# Patient Record
Sex: Female | Born: 2000
Health system: Southern US, Community
[De-identification: ages and names within clinical notes are randomized; demographics above are authoritative.]

## PROBLEM LIST (undated history)

## (undated) DIAGNOSIS — T783XXA Angioneurotic edema, initial encounter: Secondary | ICD-10-CM

## (undated) DIAGNOSIS — R000 Tachycardia, unspecified: Secondary | ICD-10-CM

## (undated) DIAGNOSIS — T7840XA Allergy, unspecified, initial encounter: Secondary | ICD-10-CM

## (undated) DIAGNOSIS — F32A Depression, unspecified: Secondary | ICD-10-CM

## (undated) DIAGNOSIS — K219 Gastro-esophageal reflux disease without esophagitis: Secondary | ICD-10-CM

## (undated) DIAGNOSIS — J45909 Unspecified asthma, uncomplicated: Secondary | ICD-10-CM

## (undated) DIAGNOSIS — L509 Urticaria, unspecified: Secondary | ICD-10-CM

## (undated) HISTORY — DX: Allergy, unspecified, initial encounter: T78.40XA

## (undated) HISTORY — DX: Angioneurotic edema, initial encounter: T78.3XXA

## (undated) HISTORY — DX: Gastro-esophageal reflux disease without esophagitis: K21.9

## (undated) HISTORY — PX: BREAST SURGERY: SHX581

## (undated) HISTORY — DX: Depression, unspecified: F32.A

## (undated) HISTORY — DX: Urticaria, unspecified: L50.9

## (undated) HISTORY — DX: Unspecified asthma, uncomplicated: J45.909

---
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
## 2022-01-02 ENCOUNTER — Other Ambulatory Visit (HOSPITAL_COMMUNITY): Payer: Self-pay

## 2022-01-02 MED ORDER — DROSPIRENONE-ETHINYL ESTRADIOL 3-0.02 MG PO TABS
1.0000 | ORAL_TABLET | Freq: Every day | ORAL | 0 refills | Status: DC
  Filled 2022-01-02 – 2022-03-17 (×2): qty 84, 84d supply, fill #0

## 2022-01-02 MED ORDER — DROSPIRENONE-ETHINYL ESTRADIOL 3-0.02 MG PO TABS
1.0000 | ORAL_TABLET | Freq: Every day | ORAL | 0 refills | Status: DC
  Filled 2022-01-02: qty 84, 84d supply, fill #0

## 2022-03-17 ENCOUNTER — Other Ambulatory Visit (HOSPITAL_COMMUNITY): Payer: Self-pay

## 2022-03-18 DIAGNOSIS — M9901 Segmental and somatic dysfunction of cervical region: Secondary | ICD-10-CM | POA: Diagnosis not present

## 2022-03-18 DIAGNOSIS — M9903 Segmental and somatic dysfunction of lumbar region: Secondary | ICD-10-CM | POA: Diagnosis not present

## 2022-03-18 DIAGNOSIS — M5416 Radiculopathy, lumbar region: Secondary | ICD-10-CM | POA: Diagnosis not present

## 2022-03-18 DIAGNOSIS — M9904 Segmental and somatic dysfunction of sacral region: Secondary | ICD-10-CM | POA: Diagnosis not present

## 2022-03-18 DIAGNOSIS — M9902 Segmental and somatic dysfunction of thoracic region: Secondary | ICD-10-CM | POA: Diagnosis not present

## 2022-03-19 ENCOUNTER — Encounter: Payer: Self-pay | Admitting: Allergy

## 2022-03-19 ENCOUNTER — Ambulatory Visit: Payer: 59 | Admitting: Allergy

## 2022-03-19 VITALS — BP 114/70 | HR 99 | Temp 97.4°F | Resp 18 | Ht 62.0 in | Wt 154.2 lb

## 2022-03-19 DIAGNOSIS — L508 Other urticaria: Secondary | ICD-10-CM | POA: Diagnosis not present

## 2022-03-19 DIAGNOSIS — J3089 Other allergic rhinitis: Secondary | ICD-10-CM

## 2022-03-19 DIAGNOSIS — H1013 Acute atopic conjunctivitis, bilateral: Secondary | ICD-10-CM | POA: Diagnosis not present

## 2022-03-19 DIAGNOSIS — T783XXD Angioneurotic edema, subsequent encounter: Secondary | ICD-10-CM

## 2022-03-19 DIAGNOSIS — T7800XA Anaphylactic reaction due to unspecified food, initial encounter: Secondary | ICD-10-CM

## 2022-03-19 MED ORDER — METHYLPREDNISOLONE ACETATE 40 MG/ML IJ SUSP
40.0000 mg | Freq: Once | INTRAMUSCULAR | Status: AC
  Administered 2022-03-19: 40 mg via INTRAMUSCULAR

## 2022-03-19 MED ORDER — EPINEPHRINE 0.3 MG/0.3ML IJ SOAJ
INTRAMUSCULAR | 1 refills | Status: DC
  Filled 2022-03-19: qty 2, 30d supply, fill #0

## 2022-03-19 MED ORDER — RYALTRIS 665-25 MCG/ACT NA SUSP: NASAL | 2 refills | Status: DC

## 2022-03-19 NOTE — Patient Instructions (Addendum)
Hives/Swelling (urticaria/angiodema) ?Take Zyrtec or Xyzal 1 tab daily with Pepcid 20mg  1 tab daily.   ?If you continue to have swelling frequently then increase the above to twice a day dosing of both. ?If you continue to have swelling then would consider Xolair monthly injections for swelling control.   ? ?Allergic rhinitis ?Continue allergen avoidance measures directed toward pollen, dust mite, cat, dog, and cockroach as listed below ?Continue cetirizine 10 mg or Xyzal 5mg  once a day as needed for runny nose or itch ?Use Ryaltris 1-2 spray in each nostril twice a day for nasal congestion or drainage ?Consider saline nasal rinses as needed for nasal symptoms. Use this before any medicated nasal sprays for best result ?Consider allergy shots as a means of long-term control.  Allergy shots "re-train" and "reset" the immune system to ignore environmental allergens and decrease the resulting immune response to those allergens (sneezing, itchy watery eyes, runny nose, nasal congestion, etc).  Allergy shots improve symptoms in 80-85% of patients. We can discuss more at the next appointment if the medications are not working for you. ? ?Allergic conjunctivitis ?Some over the counter eye drops include Pataday one drop in each eye once a day as needed for red, itchy eyes OR Zaditor one drop in each eye twice a day as needed for red itchy eyes. ? ?Foods ?Continue to avoid soy, peach, cherry, and mango.  In case of an allergic reaction, take Benadryl 50 mg every 4 hours, and if life-threatening symptoms occur, inject with EpiPen 0.3 mg. ? ?Call the clinic if this treatment plan is not working well for you ? ?Follow up in 4-6 months or sooner if needed. ?

## 2022-03-19 NOTE — Progress Notes (Signed)
? ? ?Follow-up Note ? ?RE: Victoria Dunn MRN: [id number] DOB: Jul 01, 2001 ?Date of Office Visit: 03/19/2022 ? ? ?History of present illness: ?Victoria Dunn is a 21 y.o. female presenting today for follow-up of urticaria, allergic rhinitis with conjunctivitis and food allergy. ?She was last seen in the office on 03/18/2021 by our nurse practitioner Ambs.  She presents today with her mother.   ? ?She reports on 03/08/22 she had yogurt around 9am.  The yogurt was from the cafeteria at school and it was buffet style so he was next to a container of peaches.  She is not sure if there was cross-contamination in the yogurt with the peaches as she is reactive to peaches.  She states the next morning she woke up with lip swelling.  The swelling was more prominent than the typical lip swelling she has had.  She did use her epinephrine device due to that and then went to sleep.  When she woke back up the swelling had resolved.  She however states she has been having lip swelling about every week to 2 weeks on average. ?She takes zyrtec daily.   ?Mother just bought xyzal for her to use.  ?She states she tries her best to avoid soy, peach, cherry and mango foods.  However she states soy seems to be.  So she is not sure if she is getting accidental exposures to soy.  She has access to an epinephrine device which we will refill for her. ?She states she has not had any further hive episodes since last year. ? ?She feels like her allergy symptoms are worse and she is interested in allergy shots.  She states she is doing a lot of sneezing and has throat itch as well as cough.  She would like to get a refill of the Dymista spray because that was helpful for her nasal symptoms previously.  She has access to olopatadine eyedrops but has not required use.   ? ?Review of systems: ?Review of Systems  ?Constitutional: Negative.   ?HENT:    ?     See HPI  ?Eyes: Negative.   ?Respiratory: Negative.    ?Cardiovascular: Negative.    ?Gastrointestinal: Negative.   ?Musculoskeletal: Negative.   ?Skin:   ?     See HPI  ?Allergic/Immunologic: Negative.   ?Neurological: Negative.    ? ?All other systems negative unless noted above in HPI ? ?Past medical/social/surgical/family history have been reviewed and are unchanged unless specifically indicated below. ? ?No changes ? ?Medication List: ?Current Outpatient Medications  ?Medication Sig Dispense Refill  ? Azelastine-Fluticasone 137-50 MCG/ACT SUSP Place 1 spray into the nose 2 (two) times daily. 23 g 0  ? drospirenone-ethinyl estradiol (YAZ) 3-0.02 MG tablet Take 1 tablet by mouth daily. 84 tablet 0  ? EPINEPHrine 0.3 mg/0.3 mL IJ SOAJ injection INJECT 0.3 MLS INTO THE MUSCLE ONCE FOR 1 DOSE. 2 each 1  ? famotidine (PEPCID) 20 MG tablet Take 1 tablet (20 mg total) by mouth 2 (two) times daily. 60 tablet 3  ? drospirenone-ethinyl estradiol (YAZ) 3-0.02 MG tablet TAKE 1 TABLET EVERY DAY BY MOUTH 84 tablet 4  ? ?No current facility-administered medications for this visit.  ?  ? ?Known medication allergies: ?No Known Allergies ? ? ?Physical examination: ?Blood pressure 114/70, pulse 99, temperature (!) 97.4 ?F (36.3 ?C), resp. rate 18, height 5\' 2"  (1.575 m), weight 154 lb 3.2 oz (69.9 kg), SpO2 99 %. ? ?General: Alert, interactive, in no acute distress. ?  HEENT: PERRLA, TMs pearly gray, turbinates mildly edematous without discharge, post-pharynx non erythematous. ?Neck: Supple without lymphadenopathy. ?Lungs: Clear to auscultation without wheezing, rhonchi or rales. {no increased work of breathing. ?CV: Normal S1, S2 without murmurs. ?Abdomen: Nondistended, nontender. ?Skin: Warm and dry, without lesions or rashes. ?Extremities:  No clubbing, cyanosis or edema. ?Neuro:   Grossly intact. ? ?Diagnositics/Labs: ?Depomedrol 40mg  injection provided in office ? ?Assessment and plan: ?  ?Hives/Swelling (urticaria/angiodema) ?Take Zyrtec or Xyzal 1 tab daily with Pepcid 20mg  1 tab daily.   ?If you continue to  have swelling frequently then increase the above to twice a day dosing of both. ?If you continue to have swelling then would consider Xolair monthly injections for swelling control.  Discussed protocol, benefits and risk and provided with informational brochure. ?Depomedrol injection provided in office ? ?Allergic rhinitis ?Continue allergen avoidance measures directed toward pollen, dust mite, cat, dog, and cockroach as listed below ?Continue cetirizine 10 mg or Xyzal 5mg  once a day as needed for runny nose or itch ?Use Ryaltris 1-2 spray in each nostril twice a day for nasal congestion or drainage ?Consider saline nasal rinses as needed for nasal symptoms. Use this before any medicated nasal sprays for best result ?Consider allergy shots as a means of long-term control.  Allergy shots "re-train" and "reset" the immune system to ignore environmental allergens and decrease the resulting immune response to those allergens (sneezing, itchy watery eyes, runny nose, nasal congestion, etc).  Allergy shots improve symptoms in 80-85% of patients. Depomedrol 40mg  injection provided today for symptom improvement. ? ?Allergic conjunctivitis ?Some over the counter eye drops include Pataday one drop in each eye once a day as needed for red, itchy eyes OR Zaditor one drop in each eye twice a day as needed for red itchy eyes. ? ?Foods ?Continue to avoid soy, peach, cherry, and mango.  In case of an allergic reaction, take Benadryl 50 mg every 4 hours, and if life-threatening symptoms occur, inject with EpiPen 0.3 mg. ? ?Call the clinic if this treatment plan is not working well for you ? ?Follow up in 4-6 months or sooner if needed. ? ?I appreciate the opportunity to take part in Christen's care. Please do not hesitate to contact me with questions. ? ?Sincerely, ? ? ? , MD ?Allergy/Immunology ?[facility] ? ? ?

## 2022-03-20 ENCOUNTER — Other Ambulatory Visit (HOSPITAL_COMMUNITY): Payer: Self-pay

## 2022-03-23 HISTORY — PX: BREAST REDUCTION SURGERY: SHX8

## 2022-03-25 ENCOUNTER — Ambulatory Visit (INDEPENDENT_AMBULATORY_CARE_PROVIDER_SITE_OTHER): Payer: 59 | Admitting: Surgical

## 2022-03-25 ENCOUNTER — Encounter: Payer: Self-pay | Admitting: Surgical

## 2022-03-25 ENCOUNTER — Other Ambulatory Visit (HOSPITAL_COMMUNITY): Payer: Self-pay

## 2022-03-25 VITALS — BP 139/92 | HR 100 | Ht 62.0 in | Wt 153.8 lb

## 2022-03-25 DIAGNOSIS — M545 Low back pain, unspecified: Secondary | ICD-10-CM

## 2022-03-25 DIAGNOSIS — N62 Hypertrophy of breast: Secondary | ICD-10-CM

## 2022-03-25 DIAGNOSIS — M546 Pain in thoracic spine: Secondary | ICD-10-CM

## 2022-03-25 DIAGNOSIS — M4004 Postural kyphosis, thoracic region: Secondary | ICD-10-CM

## 2022-03-25 MED ORDER — HYDROCODONE-ACETAMINOPHEN 5-325 MG PO TABS
1.0000 | ORAL_TABLET | Freq: Four times a day (QID) | ORAL | 0 refills | Status: AC | PRN
  Filled 2022-03-25: qty 28, 7d supply, fill #0

## 2022-03-25 MED ORDER — ONDANSETRON HCL 4 MG PO TABS
4.0000 mg | ORAL_TABLET | Freq: Three times a day (TID) | ORAL | 0 refills | Status: DC | PRN
  Filled 2022-03-25: qty 20, 7d supply, fill #0

## 2022-03-25 NOTE — Progress Notes (Signed)
? ?  Patient ID: Victoria Dunn, female    DOB: [birth year], 21 y.o.   MRN: [id number] ? ?Chief Complaint  ?Patient presents with  ? Pre-op Exam  ? ? ?  ICD-10-CM   ?1. Macromastia  N62   ?  ?2. Back pain of thoracolumbar region  M54.50   ? M54.6   ?  ?3. Postural kyphosis, thoracic region  M40.04   ?  ? ? ?History of Present Illness: ?Victoria Dunn is a 21 y.o.  female  with a history of macromastia.  She presents for preoperative evaluation for upcoming procedure, Bilateral Breast Reduction, scheduled for 04/03/2022 with Dr.  Arita Miss. ?Patient is a very pleasant 21 year old female who presents today with her mother and father for preop. ? ?The patient has not had problems with anesthesia. No history of DVT/PE.  No family history of DVT/PE.  No family or personal history of bleeding or clotting disorders.  Patient is not currently taking any blood thinners.  No history of CVA/MI.  ? ?Summary of Previous Visit: She is currently around an I cup and would like to be around a C cup.  She does not smoke.  She is not a diabetic.  No family history of breast cancer. ? ?Estimated excess breast tissue to be removed at time of surgery: 400 grams ? ?PMH Significant for: On OCP.  No other significant past medical history. ? ?Patient is doing well, no recent changes to her health. ? ?She would like to be about a C cup**.  If possible, patient requests photos of breast tissue removed** ? ? ?Past Medical History: ?Allergies: ?No Known Allergies ? ?Current Medications: ? ?Current Outpatient Medications:  ?  Azelastine-Fluticasone 137-50 MCG/ACT SUSP, Place 1 spray into the nose 2 (two) times daily., Disp: 23 g, Rfl: 0 ?  drospirenone-ethinyl estradiol (YAZ) 3-0.02 MG tablet, Take 1 tablet by mouth daily., Disp: 84 tablet, Rfl: 0 ?  EPINEPHrine 0.3 mg/0.3 mL IJ SOAJ injection, INJECT 0.3 MLS INTO THE MUSCLE ONCE FOR 1 DOSE., Disp: 2 each, Rfl: 1 ?  famotidine (PEPCID) 20 MG tablet, Take 1 tablet (20 mg total) by mouth 2 (two) times  daily., Disp: 60 tablet, Rfl: 3 ?  HYDROcodone-acetaminophen (NORCO) 5-325 MG tablet, Take 1 tablet by mouth every 6 (six) hours as needed for up to 7 days for moderate pain., Disp: 28 tablet, Rfl: 0 ?  Olopatadine-Mometasone (RYALTRIS) 665-25 MCG/ACT SUSP, Place 1-2 spray in each nostril twice a day for nasal congestion or drainage, Disp: 29 g, Rfl: 2 ?  ondansetron (ZOFRAN) 4 MG tablet, Take 1 tablet (4 mg total) by mouth every 8 (eight) hours as needed for nausea or vomiting., Disp: 20 tablet, Rfl: 0 ?  drospirenone-ethinyl estradiol (YAZ) 3-0.02 MG tablet, TAKE 1 TABLET EVERY DAY BY MOUTH, Disp: 84 tablet, Rfl: 4 ? ?Past Medical Problems: ?Past Medical History:  ?Diagnosis Date  ? Angio-edema   ? Urticaria   ? ? ?Past Surgical History: ?Past Surgical History:  ?Procedure Laterality Date  ? FOOT SURGERY Bilateral 10/2018  ? ? ?Social History: ?Social History  ? ?Socioeconomic History  ? Marital status: Single  ?  Spouse name: Not on file  ? Number of children: Not on file  ? Years of education: Not on file  ? Highest education level: Not on file  ?Occupational History  ? Not on file  ?Tobacco Use  ? Smoking status: Never  ? Smokeless tobacco: Never  ?Vaping Use  ? Vaping Use: Not  on file  ?Substance and Sexual Activity  ? Alcohol use: No  ? Drug use: No  ? Sexual activity: Never  ?  Birth control/protection: Pill  ?Other Topics Concern  ? Not on file  ?Social History Narrative  ? Lives with both parents and younger sister in the same household.  ? Mother is an Charity fundraiser.  ? ?Social Determinants of Health  ? ?Financial Resource Strain: Not on file  ?Food Insecurity: Not on file  ?Transportation Needs: Not on file  ?Physical Activity: Not on file  ?Stress: Not on file  ?Social Connections: Not on file  ?Intimate Partner Violence: Not on file  ? ? ?Family History: ?Family History  ?Problem Relation Age of Onset  ? Allergies Mother   ? Food Allergy Mother   ? ? ?Review of Systems: ?Review of Systems  ?Constitutional:  Negative.   ?Respiratory: Negative.    ?Cardiovascular: Negative.   ?Neurological: Negative.   ? ?Physical Exam: ?Vital Signs ?BP (!) 139/92 (BP Location: Left Arm, Patient Position: Sitting, Cuff Size: Small)   Pulse 100   Ht 5\' 2"  (1.575 m)   Wt 153 lb 12.8 oz (69.8 kg)   SpO2 100%   BMI 28.13 kg/m?  ? ?Physical Exam  ?Constitutional:   ?   General: Not in acute distress. ?   Appearance: Normal appearance. Not ill-appearing.  ?HENT:  ?   Head: Normocephalic and atraumatic.  ?Eyes:  ?   Pupils: Pupils are equal, round ?Neck:  ?   Musculoskeletal: Normal range of motion.  ?Cardiovascular:  ?   Rate and Rhythm: Normal rate ?   Pulses: Normal pulses.  ?Pulmonary:  ?   Effort: Pulmonary effort is normal. No respiratory distress.  ?Musculoskeletal: Normal range of motion.  ?Skin: ?   General: Skin is warm and dry.  ?   Findings: No erythema or rash.  ?Neurological:  ?   General: No focal deficit present.  ?   Mental Status: Alert and oriented to person, place, and time. Mental status is at baseline.  ?   Motor: No weakness.  ?Psychiatric:     ?   Mood and Affect: Mood normal.     ?   Behavior: Behavior normal.  ? ? ?Assessment/Plan: ?The patient is scheduled for bilateral breast reduction with Dr. .  Risks, benefits, and alternatives of procedure discussed, questions answered and consent obtained.   ? ?Smoking Status: Non-smoker; Counseling Given?  N/A ?Last Mammogram: No mammographic history ? ?Caprini Score: 4, moderate; Risk Factors include: Age, BMI greater than 25, on OCPs and length of planned surgery. Recommendation for mechanical prophylaxis. Encourage early ambulation.  ? ?Pictures obtained: @consult  on 11/20/2021 ? ?Post-op Rx sent to pharmacy: Norco, Zofran ? ?Patient was provided with the breast reduction and General Surgical Risk consent document and Pain Medication Agreement prior to their appointment.  They had adequate time to read through the risk consent documents and Pain Medication  Agreement. We also discussed them in person together during this preop appointment. All of their questions were answered to their satisfaction.  Recommended calling if they have any further questions.  Risk consent form and Pain Medication Agreement to be scanned into patient's chart. ? ?The risk that can be encountered with breast reduction were discussed and include the following but not limited to these:  Breast asymmetry, fluid accumulation, firmness of the breast, inability to breast feed, loss of nipple or areola, skin loss, decrease or no nipple sensation, fat necrosis of the breast  tissue, bleeding, infection, healing delay.  There are risks of anesthesia, changes to skin sensation and injury to nerves or blood vessels.  The muscle can be temporarily or permanently injured.  You may have an allergic reaction to tape, suture, glue, blood products which can result in skin discoloration, swelling, pain, skin lesions, poor healing.  Any of these can lead to the need for revisonal surgery or stage procedures.  A reduction has potential to interfere with diagnostic procedures.  Nipple or breast piercing can increase risks of infection.  This procedure is best done when the breast is fully developed.  Changes in the breast will continue to occur over time.  Pregnancy can alter the outcomes of previous breast reduction surgery, weight gain and weigh loss can also effect the long term appearance.  ? ? ? ?Electronically signed by: Kermit BaloMatthew J Pennye Beeghly, PA-C 03/25/2022 3:47 PM ?

## 2022-03-30 ENCOUNTER — Telehealth: Payer: Self-pay | Admitting: Plastic Surgery

## 2022-03-30 NOTE — Telephone Encounter (Signed)
Called patient to advise that  her surgery would be starting at 11:00 am instead of 12:00 pm and she would need to be at [facility] at 9:30am. Patient stated understanding.  ?

## 2022-04-03 DIAGNOSIS — M4004 Postural kyphosis, thoracic region: Secondary | ICD-10-CM | POA: Diagnosis not present

## 2022-04-03 DIAGNOSIS — N62 Hypertrophy of breast: Secondary | ICD-10-CM | POA: Diagnosis not present

## 2022-04-03 DIAGNOSIS — M545 Low back pain, unspecified: Secondary | ICD-10-CM | POA: Diagnosis not present

## 2022-04-06 ENCOUNTER — Encounter (HOSPITAL_BASED_OUTPATIENT_CLINIC_OR_DEPARTMENT_OTHER): Payer: Self-pay

## 2022-04-06 ENCOUNTER — Ambulatory Visit (HOSPITAL_BASED_OUTPATIENT_CLINIC_OR_DEPARTMENT_OTHER): Admit: 2022-04-06 | Payer: 59 | Admitting: Plastic Surgery

## 2022-04-06 SURGERY — MAMMOPLASTY, REDUCTION
Anesthesia: General | Site: Breast | Laterality: Bilateral

## 2022-04-09 ENCOUNTER — Telehealth: Payer: Self-pay | Admitting: *Deleted

## 2022-04-09 ENCOUNTER — Ambulatory Visit (INDEPENDENT_AMBULATORY_CARE_PROVIDER_SITE_OTHER): Payer: 59 | Admitting: Plastic Surgery

## 2022-04-09 ENCOUNTER — Encounter: Payer: Self-pay | Admitting: Plastic Surgery

## 2022-04-09 DIAGNOSIS — N62 Hypertrophy of breast: Secondary | ICD-10-CM

## 2022-04-09 NOTE — Progress Notes (Signed)
Patient presents about 1 week out from bilateral breast reduction.  She feels good and is happy.  On exam everything looks to be healing fine with intact incisions and viable nipple areolar complexes.  There is no subcutaneous fluid.  I have asked her to continue compressive garments and avoid strenuous activity.  We will see her again in a few weeks.  

## 2022-04-09 NOTE — Telephone Encounter (Signed)
Received a call from the patient's mother on (04/07/22) stating that the patient had a breast reduction on last Friday.  The patient wake up this morning the ace wrap had come off,the bandages were off, and she was on her stomach.  She stated that they was no drainage or major issues.    The mother stated that she wanted to know if there is something she needs to look out for.    Informed the mother that she can watch for any extra drainage.  Asked the mother to let the patient know to try to stay off her stomach,which we know is hard to do.  Make sure she keeps the wrap snug,but not to tight.   The mother verbalized understanding and agreed.//AB/CMA

## 2022-04-15 ENCOUNTER — Encounter: Payer: 59 | Admitting: Plastic Surgery

## 2022-04-15 ENCOUNTER — Telehealth: Payer: Self-pay | Admitting: *Deleted

## 2022-04-15 NOTE — Telephone Encounter (Signed)
Received [facility] Report from [facility] via of mail.  Given to provider to review.    Surgical Pathology Report reviewed and copy scanned into the chart.//AB/CMA

## 2022-04-30 ENCOUNTER — Ambulatory Visit (INDEPENDENT_AMBULATORY_CARE_PROVIDER_SITE_OTHER): Payer: 59 | Admitting: Plastic Surgery

## 2022-04-30 ENCOUNTER — Encounter: Payer: Self-pay | Admitting: Plastic Surgery

## 2022-04-30 DIAGNOSIS — N62 Hypertrophy of breast: Secondary | ICD-10-CM

## 2022-04-30 NOTE — Progress Notes (Signed)
Patient presents about 3 weeks postop from bilateral breast reduction.  She is overall very happy.  She has some discomfort in the right axillary area but otherwise feels like things are coming along nicely.  On exam everything looks to be healing well.  She has a nice symmetric result.  Incisions are intact with no subcutaneous fluid that I can appreciate.  We discussed managing compression going forward and what to expect in terms of activity restrictions.  All of her and her mother's questions were answered we will plan to see them again on an as-needed basis.

## 2022-06-04 ENCOUNTER — Ambulatory Visit (INDEPENDENT_AMBULATORY_CARE_PROVIDER_SITE_OTHER): Payer: 59 | Admitting: Family Medicine

## 2022-06-04 VITALS — BP 134/90 | HR 98 | Temp 98.6°F | Ht 63.0 in | Wt 153.2 lb

## 2022-06-04 DIAGNOSIS — J3089 Other allergic rhinitis: Secondary | ICD-10-CM | POA: Diagnosis not present

## 2022-06-04 DIAGNOSIS — Z7689 Persons encountering health services in other specified circumstances: Secondary | ICD-10-CM | POA: Diagnosis not present

## 2022-06-04 DIAGNOSIS — Z91018 Allergy to other foods: Secondary | ICD-10-CM | POA: Diagnosis not present

## 2022-06-04 NOTE — Progress Notes (Signed)
Patient presents to clinic today to f/u on chronic issues and establish care.  SUBJECTIVE: PMH: Pt is a 21 yo female with pmh sig for allergies.  Previously seen at [facility] peds.  Seasonal and Environmental Allergies: -taking nasal spray, pecid 20 mg BID -tried allegra -typical symptoms include sneezing, coughing, itchy eyes, rhinorrhea, sore throat -seen by Allergist in May -allergic to dogs, cats, trees, grass -NKDA -Food allergies: soy, mangoes, cherries.  Cause tongue and lip edema. -has Epi pen  Past Surgical hx: -toe(s) surgery 2019 -breast reduction 2023  Social hx: Pt is single. She is a HS graduate.  Pt denies EtOH, tobacco, and drug use.  LMP 05/15/22.  Health Maintenance: Dental -- Vision --[facility], Dr. Sharlot Gowda Immunizations -- Colonoscopy -- Mammogram -- PAP --  Bone Density --    Family Medical Hx: Mom-Alive Dad-Alive, MI, HLD Sister-Zoe, alive MGM-alive, arthritis PGM-alive, arthritis, DM, HLD  Past Medical History:  Diagnosis Date   Angio-edema    Urticaria     Past Surgical History:  Procedure Laterality Date   FOOT SURGERY Bilateral 10/2018    Current Outpatient Medications on File Prior to Visit  Medication Sig Dispense Refill   Azelastine-Fluticasone 137-50 MCG/ACT SUSP Place 1 spray into the nose 2 (two) times daily. 23 g 0   drospirenone-ethinyl estradiol (YAZ) 3-0.02 MG tablet Take 1 tablet by mouth daily. 84 tablet 0   EPINEPHrine 0.3 mg/0.3 mL IJ SOAJ injection INJECT 0.3 MLS INTO THE MUSCLE ONCE FOR 1 DOSE. 2 each 1   famotidine (PEPCID) 20 MG tablet Take 1 tablet (20 mg total) by mouth 2 (two) times daily. 60 tablet 3   Olopatadine-Mometasone (RYALTRIS) 665-25 MCG/ACT SUSP Place 1-2 spray in each nostril twice a day for nasal congestion or drainage 29 g 2   drospirenone-ethinyl estradiol (YAZ) 3-0.02 MG tablet TAKE 1 TABLET EVERY DAY BY MOUTH 84 tablet 4   ondansetron (ZOFRAN) 4 MG tablet Take 1 tablet (4 mg total) by mouth every  8 (eight) hours as needed for nausea or vomiting. (Patient not taking: Reported on 06/04/2022) 20 tablet 0   No current facility-administered medications on file prior to visit.    No Known Allergies  Family History  Problem Relation Age of Onset   Allergies Mother    Food Allergy Mother     Social History   Socioeconomic History   Marital status: Single    Spouse name: Not on file   Number of children: Not on file   Years of education: Not on file   Highest education level: Not on file  Occupational History   Not on file  Tobacco Use   Smoking status: Never   Smokeless tobacco: Never  Vaping Use   Vaping Use: Not on file  Substance and Sexual Activity   Alcohol use: No   Drug use: No   Sexual activity: Never    Birth control/protection: Pill  Other Topics Concern   Not on file  Social History Narrative   Lives with both parents and younger sister in the same household.   Mother is an Charity fundraiser.   Social Determinants of Health   Financial Resource Strain: Not on file  Food Insecurity: Not on file  Transportation Needs: Not on file  Physical Activity: Not on file  Stress: Not on file  Social Connections: Not on file  Intimate Partner Violence: Not on file    ROS General: Denies fever, chills, night sweats, changes in weight, changes in appetite HEENT:  Denies headaches, ear pain, changes in vision.  + Rhinorrhea, itchy eyes, sore throat, sneezing related to seasonal and environmental allergies CV: Denies CP, palpitations, SOB, orthopnea Pulm: Denies SOB, cough, wheezing +coughing 2/2 allergies GI: Denies abdominal pain, nausea, vomiting, diarrhea, constipation GU: Denies dysuria, hematuria, frequency, vaginal discharge Msk: Denies muscle cramps, joint pains Neuro: Denies weakness, numbness, tingling Skin: Denies rashes, bruising Psych: Denies depression, anxiety, hallucinations   BP 134/90 (BP Location: Right Arm, Patient Position: Sitting, Cuff Size: Normal)    Pulse 98   Temp 98.6 F (37 C) (Oral)   Ht 5\' 3"  (1.6 m)   Wt 153 lb 3.2 oz (69.5 kg)   LMP 05/15/2022   SpO2 98%   BMI 27.14 kg/m   Physical Exam Gen. Pleasant, well developed, well-nourished, in NAD HEENT - [place]/AT, PERRL, EOMI, conjunctive clear, no scleral icterus, no nasal drainage, TMs normal. Lungs: no use of accessory muscles,CTAB, no wheezes, rales or rhonchi Cardiovascular: RRR,  No r/g/m, no peripheral edema Musculoskeletal: No deformities, moves all four extremities, no cyanosis or clubbing, normal tone Neuro:  A&Ox3, CN II-XII intact, normal gait Skin:  Warm, dry, intact, no lesions  No results found for this or any previous visit (from the past 2160 hour(s)).  Assessment/Plan: Environmental and seasonal allergies -avoid triggers when possible -continue current med regimen of Azelastine-fluticasone 137-50 mcg/act nasal spray, pepcid 20 mg BID -consider singulair -continue f/u with allergist  Multiple food allergies -soy, mangoes, cherries -epi pen -continue f/u with allergist  Encounter to establish care -We reviewed the PMH, PSH, FH, SH, Meds and Allergies. -We provided refills for any medications we will prescribe as needed. -We addressed current concerns per orders and patient instructions. -We have asked for records for pertinent exams, studies, vaccines and notes from previous providers. -We have advised patient to follow up per instructions below.  F/u prn for CPE and labs  2161, MD

## 2022-06-07 ENCOUNTER — Other Ambulatory Visit (HOSPITAL_COMMUNITY): Payer: Self-pay

## 2022-06-08 ENCOUNTER — Other Ambulatory Visit (HOSPITAL_COMMUNITY): Payer: Self-pay

## 2022-06-08 MED ORDER — DROSPIRENONE-ETHINYL ESTRADIOL 3-0.02 MG PO TABS
1.0000 | ORAL_TABLET | Freq: Every day | ORAL | 0 refills | Status: DC
  Filled 2022-06-08 – 2022-06-13 (×2): qty 28, 28d supply, fill #0

## 2022-06-09 ENCOUNTER — Other Ambulatory Visit (HOSPITAL_COMMUNITY): Payer: Self-pay

## 2022-06-12 ENCOUNTER — Other Ambulatory Visit (HOSPITAL_COMMUNITY): Payer: Self-pay

## 2022-06-13 ENCOUNTER — Other Ambulatory Visit (HOSPITAL_COMMUNITY): Payer: Self-pay

## 2022-06-14 ENCOUNTER — Encounter: Payer: Self-pay | Admitting: Family Medicine

## 2022-06-14 DIAGNOSIS — Z91018 Allergy to other foods: Secondary | ICD-10-CM | POA: Insufficient documentation

## 2022-06-14 DIAGNOSIS — T781XXA Other adverse food reactions, not elsewhere classified, initial encounter: Secondary | ICD-10-CM | POA: Insufficient documentation

## 2022-06-14 DIAGNOSIS — J3089 Other allergic rhinitis: Secondary | ICD-10-CM | POA: Insufficient documentation

## 2022-06-15 ENCOUNTER — Other Ambulatory Visit (HOSPITAL_COMMUNITY): Payer: Self-pay

## 2022-06-16 ENCOUNTER — Other Ambulatory Visit (HOSPITAL_COMMUNITY): Payer: Self-pay

## 2022-06-16 DIAGNOSIS — N946 Dysmenorrhea, unspecified: Secondary | ICD-10-CM | POA: Diagnosis not present

## 2022-06-16 DIAGNOSIS — Z6826 Body mass index (BMI) 26.0-26.9, adult: Secondary | ICD-10-CM | POA: Diagnosis not present

## 2022-06-16 DIAGNOSIS — Z01419 Encounter for gynecological examination (general) (routine) without abnormal findings: Secondary | ICD-10-CM | POA: Diagnosis not present

## 2022-06-16 MED ORDER — DROSPIRENONE-ETHINYL ESTRADIOL 3-0.02 MG PO TABS
1.0000 | ORAL_TABLET | Freq: Every day | ORAL | 4 refills | Status: DC
  Filled 2022-06-16 – 2022-07-04 (×2): qty 84, 84d supply, fill #0
  Filled 2022-09-08: qty 84, 84d supply, fill #1
  Filled 2022-11-10 – 2022-12-03 (×4): qty 84, 84d supply, fill #2
  Filled 2023-02-16 – 2023-02-21 (×2): qty 84, 84d supply, fill #3
  Filled 2023-04-30 – 2023-05-15 (×4): qty 84, 84d supply, fill #4

## 2022-07-06 ENCOUNTER — Other Ambulatory Visit (HOSPITAL_COMMUNITY): Payer: Self-pay

## 2022-07-24 HISTORY — PX: FOOT SURGERY: SHX648

## 2022-08-12 ENCOUNTER — Ambulatory Visit (INDEPENDENT_AMBULATORY_CARE_PROVIDER_SITE_OTHER): Payer: 59

## 2022-08-12 ENCOUNTER — Ambulatory Visit (INDEPENDENT_AMBULATORY_CARE_PROVIDER_SITE_OTHER): Payer: 59 | Admitting: Podiatry

## 2022-08-12 DIAGNOSIS — M67472 Ganglion, left ankle and foot: Secondary | ICD-10-CM | POA: Diagnosis not present

## 2022-08-12 MED ORDER — BETAMETHASONE SOD PHOS & ACET 6 (3-3) MG/ML IJ SUSP: 3.0000 mg | Freq: Once | INTRAMUSCULAR | Status: DC

## 2022-08-12 NOTE — Progress Notes (Signed)
Chief Complaint  Patient presents with   Foot Problem    Knot on left foot that has been present for 1 year.    HPI: 21 y.o. female presenting today as a reestablish new patient with her family for evaluation of pain and tenderness associated to the dorsum of the left foot.  Patient states that she has noticed a "lump" to the dorsum of the left foot for about 1 year now.  It is very painful on a daily basis.  It rubs against all of her shoes.  She presents for further treatment and evaluation  Past Medical History:  Diagnosis Date   Angio-edema    Urticaria     Past Surgical History:  Procedure Laterality Date   FOOT SURGERY Bilateral 10/2018    Allergies  Allergen Reactions   Soy Allergy Swelling    Soy, mangoes, cherries-tongue and lip edema.  Has epi pen.     Physical Exam: General: The patient is alert and oriented x3 in no acute distress.  Dermatology: Skin is warm, dry and supple bilateral lower extremities. Negative for open lesions or macerations.  Vascular: Palpable pedal pulses bilaterally. Capillary refill within normal limits.  Negative for any significant edema or erythema  Neurological: Light touch and protective threshold grossly intact  Musculoskeletal Exam: No pedal deformities noted.  There is a tender symptomatic lesion to the dorsum of the left foot overlying the midtarsal joints.  Not adhered and fluctuant.  Findings consistent with a ganglion cyst  Radiographic Exam LT foot 08/12/2022:  Normal osseous mineralization. Joint spaces preserved. No fracture/dislocation/boney destruction.    Assessment: 1.  Ganglion cyst left foot x1 year   Plan of Care:  1. Patient evaluated. X-Rays reviewed.  2.  The patient has had a ganglion cyst for about 1 year now.  I did discuss possible draining the ganglion cyst however since the patient has had this for about 1 year I am not hopeful that this will permanently alleviate her symptoms.  She is leaving for school  and the family and the patient would like to try this modality today.  The area was prepped aseptically and 3 mL of 2% lidocaine plain was infiltrated in a proximal V block fashion.  18-gauge needle was inserted but I was unable to aspirate any ganglion cystic fluid from the area.  Light dressing applied 3.  Injection of 1 mL Celestone Soluspan injected into the ganglion cyst lesion to help reduce inflammation and alleviate pain and pressure from the area 4.  If the ganglion cyst continues to be present into the holidays I do recommend surgical excision.  Patient will be back home for the holidays from college and would like to have the surgery performed at that time if she is continuing to have symptoms. 5.  Risk benefits advantages and disadvantages of the procedure were explained.  No guarantees were expressed or implied.  The surgery would consist of excision of ganglion cyst left dorsal foot.  Authorization for surgery forms will be initiated when the patient contacts our office for scheduling  *Student at [employer].  Mother is a Marine scientist at [employer].  Father is a paramedic       Edrick Kins, DPM [facility]  Dr. Edrick Kins, DPM    [address]  [place], [place] [address]                Office [phone]  Fax [phone]

## 2022-08-18 ENCOUNTER — Telehealth: Payer: Self-pay

## 2022-08-18 NOTE — Telephone Encounter (Signed)
DOS 08/27/2022  EXC GANGLION CYST LT - 28090  [facility] EFFECTIVE DATE - 04/03/2022  PLAN DEDUCTIBLE - $350.00 W/ $0.00 REMAINING OUT OF POCKET - $7900.00 W/ $5784.69 REMAINING COPAY $0.00 COINSURANCE - 60%  SPOKE TO JAY AT [facility], HE STATED NO PRECERT REQUIRED FOR CPT 28090.  CALL REF# [id number]

## 2022-08-27 ENCOUNTER — Telehealth: Payer: Self-pay | Admitting: Family Medicine

## 2022-08-27 ENCOUNTER — Other Ambulatory Visit: Payer: Self-pay | Admitting: Podiatry

## 2022-08-27 ENCOUNTER — Other Ambulatory Visit (HOSPITAL_COMMUNITY): Payer: Self-pay

## 2022-08-27 DIAGNOSIS — M67472 Ganglion, left ankle and foot: Secondary | ICD-10-CM | POA: Diagnosis not present

## 2022-08-27 MED ORDER — OXYCODONE-ACETAMINOPHEN 5-325 MG PO TABS
1.0000 | ORAL_TABLET | ORAL | 0 refills | Status: DC | PRN
  Filled 2022-08-27: qty 20, 4d supply, fill #0

## 2022-08-27 MED ORDER — DICLOFENAC SODIUM 75 MG PO TBEC
75.0000 mg | DELAYED_RELEASE_TABLET | Freq: Two times a day (BID) | ORAL | 1 refills | Status: DC
  Filled 2022-08-27: qty 60, 30d supply, fill #0

## 2022-08-27 NOTE — Telephone Encounter (Signed)
Pt is having surgery today and per mom she has elevated heart rate and mom is a nurse and she has monitoring her heart rate. Pt need a referral to cardiologist dr Oswaldo Milian phone number [phone]. Pt mom is aware md not in the office this week

## 2022-08-27 NOTE — Telephone Encounter (Signed)
LVM advising mother that since this is new for patient, OV will be needed for referral.

## 2022-08-27 NOTE — Progress Notes (Signed)
PRN postop 

## 2022-08-28 NOTE — Telephone Encounter (Signed)
Patient requested VV since she is away at college, scheduled

## 2022-08-31 ENCOUNTER — Encounter: Payer: Self-pay | Admitting: Podiatry

## 2022-08-31 ENCOUNTER — Ambulatory Visit (INDEPENDENT_AMBULATORY_CARE_PROVIDER_SITE_OTHER): Payer: 59 | Admitting: Podiatry

## 2022-08-31 DIAGNOSIS — Z9889 Other specified postprocedural states: Secondary | ICD-10-CM

## 2022-09-02 ENCOUNTER — Encounter: Payer: 59 | Admitting: Podiatry

## 2022-09-02 ENCOUNTER — Telehealth (INDEPENDENT_AMBULATORY_CARE_PROVIDER_SITE_OTHER): Payer: 59 | Admitting: Family Medicine

## 2022-09-02 ENCOUNTER — Encounter: Payer: Self-pay | Admitting: Family Medicine

## 2022-09-02 DIAGNOSIS — R000 Tachycardia, unspecified: Secondary | ICD-10-CM | POA: Diagnosis not present

## 2022-09-02 NOTE — Progress Notes (Signed)
Virtual Visit via Video Note  I connected with on 09/02/22 at  2:00 PM EDT by a video enabled telemedicine application 2/2 [id number] pandemic and verified that I am speaking with the correct person using two identifiers.  Location patient: home Location provider:work or home office Persons participating in the virtual visit: patient, provider  I discussed the limitations of evaluation and management by telemedicine and the availability of in person appointments. The patient expressed understanding and agreed to proceed. Chief Complaint  Patient presents with   Heart Problem    Having elevated heart rate. Resting 90-100    HPI: Pt's resting HR intermittently in the 90s over the last yr, but getting worse, now more consistent.  Pt's watch lets her know it is up.  Feels like heart is beating harder.  Cycling and walking for exercise x 2 months.  Denies HAs, changes in vision.  Endorses chest pressure with laying dow or sitting down, not necessarily a/w the tachycardia.  Pt states HR was 130s last wk prior to surgery.  Patient states she was advised to see Cardiology.  Patient denies increased caffeine intake, stress.  Drinking more water does not help relieve symptoms.  ROS: See pertinent positives and negatives per HPI.  Past Medical History:  Diagnosis Date   Angio-edema    Urticaria     Past Surgical History:  Procedure Laterality Date   FOOT SURGERY Bilateral 10/2018    Family History  Problem Relation Age of Onset   Allergies Mother    Food Allergy Mother     EXAM:  VITALS per patient if applicable:  RR between 12-20 bpm  GENERAL: alert, oriented, appears well and in no acute distress  HEENT: atraumatic, conjunctiva clear, no obvious abnormalities on inspection of external nose and ears  NECK: normal movements of the head and neck  LUNGS: on inspection no signs of respiratory distress, breathing rate appears normal, no obvious gross SOB, gasping or wheezing  CV: no  obvious cyanosis  MS: moves all visible extremities without noticeable abnormality  PSYCH/NEURO: pleasant and cooperative, no obvious depression or anxiety, speech and thought processing grossly intact  ASSESSMENT AND PLAN:  Discussed the following assessment and plan:  Tachycardia -New problem -pt advised on need for in person evaluation. -discussed possible causes of symptoms -orders for labs placed to further evaluate -We will also need EKG -Based on results and EKG cardiology referral if needed  - Plan: TSH, T4, Free, CMP, CBC with Differential/Platelet, EKG 12-Lead, Hemoglobin A1c, Iron, TIBC and Ferritin Panel  Follow-up as needed   I discussed the assessment and treatment plan with the patient. The patient was provided an opportunity to ask questions and all were answered. The patient agreed with the plan and demonstrated an understanding of the instructions.   The patient was advised to call back or seek an in-person evaluation if the symptoms worsen or if the condition fails to improve as anticipated.    Billie Ruddy, MD

## 2022-09-05 NOTE — Progress Notes (Signed)
   No chief complaint on file.   Subjective:  Patient presents today status post excision of ganglion cyst to the left foot. DOS: 08/27/2022.  Patient states that she is doing well.  She has kept the dressings clean dry and intact and wearing the cam boot as instructed.  No new complaints at this time  Past Medical History:  Diagnosis Date   Angio-edema    Urticaria     Past Surgical History:  Procedure Laterality Date   FOOT SURGERY Bilateral 10/2018    Allergies  Allergen Reactions   Soy Allergy Swelling    Soy, mangoes, cherries-tongue and lip edema.  Has epi pen.    Objective/Physical Exam Neurovascular status intact.  Skin incisions appear to be well coapted with sutures intact. No sign of infectious process noted. No dehiscence. No active bleeding noted.  Minimal edema noted to the surgical area.  Assessment: 1. s/p excision of ganglion cyst left foot. DOS: 08/27/2022   Plan of Care:  1. Patient was evaluated.  2.  Dressings changed. 3.  Recommend light dressing daily.  Patient may now transition out of the postsurgical shoe into good supportive sneakers 4.  Return to clinic in about 2 weeks for suture removal  Edrick Kins, DPM [facility]  Dr. Edrick Kins, DPM    [address], [place] [address]                Office [phone]  Fax [phone]

## 2022-09-09 ENCOUNTER — Ambulatory Visit (INDEPENDENT_AMBULATORY_CARE_PROVIDER_SITE_OTHER): Payer: 59 | Admitting: Podiatry

## 2022-09-09 ENCOUNTER — Other Ambulatory Visit (INDEPENDENT_AMBULATORY_CARE_PROVIDER_SITE_OTHER): Payer: 59

## 2022-09-09 ENCOUNTER — Other Ambulatory Visit (HOSPITAL_COMMUNITY): Payer: Self-pay

## 2022-09-09 ENCOUNTER — Encounter: Payer: Self-pay | Admitting: Podiatry

## 2022-09-09 DIAGNOSIS — R000 Tachycardia, unspecified: Secondary | ICD-10-CM | POA: Diagnosis not present

## 2022-09-09 DIAGNOSIS — Z9889 Other specified postprocedural states: Secondary | ICD-10-CM

## 2022-09-09 LAB — CBC WITH DIFFERENTIAL/PLATELET
Basophils Absolute: 0 10*3/uL (ref 0.0–0.1)
Basophils Relative: 0.3 % (ref 0.0–3.0)
Eosinophils Absolute: 0.2 10*3/uL (ref 0.0–0.7)
Eosinophils Relative: 4 % (ref 0.0–5.0)
HCT: 40.3 % (ref 36.0–49.0)
Hemoglobin: 13.1 g/dL (ref 12.0–16.0)
Lymphocytes Relative: 35.9 % (ref 24.0–48.0)
Lymphs Abs: 1.9 10*3/uL (ref 0.7–4.0)
MCHC: 32.5 g/dL (ref 31.0–37.0)
MCV: 85.8 fl (ref 78.0–98.0)
Monocytes Absolute: 0.5 10*3/uL (ref 0.1–1.0)
Monocytes Relative: 8.6 % (ref 3.0–12.0)
Neutro Abs: 2.7 10*3/uL (ref 1.4–7.7)
Neutrophils Relative %: 51.2 % (ref 43.0–71.0)
Platelets: 280 10*3/uL (ref 150.0–575.0)
RBC: 4.69 Mil/uL (ref 3.80–5.70)
RDW: 13.2 % (ref 11.4–15.5)
WBC: 5.3 10*3/uL (ref 4.5–13.5)

## 2022-09-09 LAB — COMPREHENSIVE METABOLIC PANEL
ALT: 24 U/L (ref 0–35)
AST: 24 U/L (ref 0–37)
Albumin: 4 g/dL (ref 3.5–5.2)
Alkaline Phosphatase: 65 U/L (ref 47–119)
BUN: 10 mg/dL (ref 6–23)
CO2: 27 mEq/L (ref 19–32)
Calcium: 9.6 mg/dL (ref 8.4–10.5)
Chloride: 99 mEq/L (ref 96–112)
Creatinine, Ser: 0.68 mg/dL (ref 0.40–1.20)
GFR: 126.11 mL/min (ref >60.00)
Glucose, Bld: 90 mg/dL (ref 70–99)
Potassium: 4.2 mEq/L (ref 3.5–5.1)
Sodium: 135 mEq/L (ref 135–145)
Total Bilirubin: 0.6 mg/dL (ref 0.2–1.2)
Total Protein: 7.4 g/dL (ref 6.0–8.3)

## 2022-09-09 LAB — IBC + FERRITIN
Ferritin: 27.9 ng/mL (ref 10.0–291.0)
Iron: 89 ug/dL (ref 42–145)
Saturation Ratios: 21.6 % (ref 20.0–50.0)
TIBC: 411.6 ug/dL (ref 250.0–450.0)
Transferrin: 294 mg/dL (ref 212.0–360.0)

## 2022-09-09 LAB — T4, FREE: Free T4: 1.08 ng/dL (ref 0.60–1.60)

## 2022-09-09 LAB — HEMOGLOBIN A1C: Hgb A1c MFr Bld: 6 % (ref 4.6–6.5)

## 2022-09-09 LAB — TSH: TSH: 2.06 u[IU]/mL (ref 0.40–5.00)

## 2022-09-09 NOTE — Progress Notes (Signed)
   Chief Complaint  Patient presents with   Post-op Follow-up    Patient is here for POV #2 DOS 08/27/2022 EXCISION GANGLION CYST LT FOOT    Subjective:  Patient presents today status post excision of ganglion cyst to the left foot. DOS: 08/27/2022.  Patient is doing well.  No new complaints at this time  Past Medical History:  Diagnosis Date   Angio-edema    Urticaria     Past Surgical History:  Procedure Laterality Date   FOOT SURGERY Bilateral 10/2018    Allergies  Allergen Reactions   Soy Allergy Swelling    Soy, mangoes, cherries-tongue and lip edema.  Has epi pen.    Objective/Physical Exam Neurovascular status intact.  Skin incisions appear to be well coapted with sutures intact. No sign of infectious process noted. No dehiscence. No active bleeding noted.  Minimal edema localized around the incision area  Assessment: 1. s/p excision of ganglion cyst left foot. DOS: 08/27/2022   Plan of Care:  1. Patient was evaluated.  2.  Sutures removed 3.  Recommend compression daily 4.  Patient may discontinue postsurgical shoe and wear good supportive sneakers that do not irritate the dorsum of the foot.   5.  Return to clinic 6 weeks  Edrick Kins, DPM [facility]  Dr. Edrick Kins, DPM    [address], [place] [address]                Office [phone]  Fax [phone]

## 2022-09-10 ENCOUNTER — Other Ambulatory Visit (HOSPITAL_COMMUNITY): Payer: Self-pay

## 2022-09-11 ENCOUNTER — Other Ambulatory Visit (HOSPITAL_COMMUNITY): Payer: Self-pay

## 2022-09-12 ENCOUNTER — Other Ambulatory Visit (HOSPITAL_COMMUNITY): Payer: Self-pay

## 2022-09-16 ENCOUNTER — Encounter: Payer: Self-pay | Admitting: Family Medicine

## 2022-09-23 ENCOUNTER — Encounter: Payer: 59 | Admitting: Podiatry

## 2022-09-24 ENCOUNTER — Telehealth: Payer: Self-pay | Admitting: Family Medicine

## 2022-09-24 DIAGNOSIS — R000 Tachycardia, unspecified: Secondary | ICD-10-CM

## 2022-09-24 NOTE — Telephone Encounter (Signed)
Pt mom is calling and would like a referral for her daughter to see cardiologist due to tachycardia

## 2022-09-25 DIAGNOSIS — J029 Acute pharyngitis, unspecified: Secondary | ICD-10-CM | POA: Diagnosis not present

## 2022-09-25 DIAGNOSIS — K0889 Other specified disorders of teeth and supporting structures: Secondary | ICD-10-CM | POA: Diagnosis not present

## 2022-09-25 NOTE — Telephone Encounter (Signed)
Pt's mother called back to say they still have not received the referral.  (Pt was also on the line)  Pt was offered an OV to have an EKG for the cardio referral.   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  Mother stated she wants a call back from the MD and not the CMA to explain to her the reason MD needs Pt to come in again.  [phone]

## 2022-09-27 DIAGNOSIS — Z20822 Contact with and (suspected) exposure to covid-19: Secondary | ICD-10-CM | POA: Diagnosis not present

## 2022-09-27 DIAGNOSIS — J039 Acute tonsillitis, unspecified: Secondary | ICD-10-CM | POA: Diagnosis not present

## 2022-09-27 DIAGNOSIS — R000 Tachycardia, unspecified: Secondary | ICD-10-CM | POA: Diagnosis not present

## 2022-09-28 NOTE — Telephone Encounter (Signed)
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  Ok to place referral.

## 2022-10-05 NOTE — Telephone Encounter (Signed)
Referral placed.

## 2022-10-22 ENCOUNTER — Ambulatory Visit: Payer: 59 | Attending: Cardiology | Admitting: Cardiology

## 2022-10-22 ENCOUNTER — Encounter: Payer: Self-pay | Admitting: Cardiology

## 2022-10-22 ENCOUNTER — Ambulatory Visit (INDEPENDENT_AMBULATORY_CARE_PROVIDER_SITE_OTHER): Payer: 59

## 2022-10-22 VITALS — BP 118/66 | HR 101 | Ht 63.0 in | Wt 159.9 lb

## 2022-10-22 DIAGNOSIS — R002 Palpitations: Secondary | ICD-10-CM | POA: Diagnosis not present

## 2022-10-22 DIAGNOSIS — R55 Syncope and collapse: Secondary | ICD-10-CM | POA: Diagnosis not present

## 2022-10-22 DIAGNOSIS — R9431 Abnormal electrocardiogram [ECG] [EKG]: Secondary | ICD-10-CM

## 2022-10-22 DIAGNOSIS — R42 Dizziness and giddiness: Secondary | ICD-10-CM | POA: Diagnosis not present

## 2022-10-22 NOTE — Patient Instructions (Signed)
Medication Instructions:  Your physician recommends that you continue on your current medications as directed. Please refer to the Current Medication list given to you today.  *If you need a refill on your cardiac medications before your next appointment, please call your pharmacy*  Testing/Procedures: Your physician has requested that you have an echocardiogram. Echocardiography is a painless test that uses sound waves to create images of your heart. It provides your doctor with information about the size and shape of your heart and how well your heart's chambers and valves are working. This procedure takes approximately one hour. There are no restrictions for this procedure. Please do NOT wear cologne, perfume, aftershave, or lotions (deodorant is allowed). Please arrive 15 minutes prior to your appointment time.  Your physician has requested that you have an exercise tolerance test. For further information please visit [email or web address] Please also follow instruction sheet, as given.  ZIO XT- Long Term Monitor Instructions   Your physician has requested you wear a ZIO patch monitor for _7__ days.  This is a single patch monitor.   [employer] supplies one patch monitor per enrollment. Additional stickers are not available. Please do not apply patch if you will be having a Nuclear Stress Test, Echocardiogram, Cardiac CT, MRI, or Chest Xray during the period you would be wearing the monitor. The patch cannot be worn during these tests. You cannot remove and re-apply the ZIO XT patch monitor.  Your ZIO patch monitor will be sent Fed Ex from [employer] directly to your home address. It may take 3-5 days to receive your monitor after you have been enrolled.  Once you have received your monitor, please review the enclosed instructions. Your monitor has already been registered assigning a specific monitor serial # to you.  Billing and Patient Assistance Program Information   We have  supplied [employer] with any of your insurance information on file for billing purposes. [employer] offers a sliding scale Patient Assistance Program for patients that do not have insurance, or whose insurance does not completely cover the cost of the ZIO monitor.   You must apply for the Patient Assistance Program to qualify for this discounted rate.     To apply, please call [employer] at [phone], select option 4, then select option 2, and ask to apply for Patient Assistance Program.  Meredeth Ide will ask your household income, and how many people are in your household.  They will quote your out-of-pocket cost based on that information.  [employer] will also be able to set up a 21-month, interest-free payment plan if needed.  Applying the monitor   Shave hair from upper left chest.  Hold abrader disc by orange tab. Rub abrader in 40 strokes over the upper left chest as indicated in your monitor instructions.  Clean area with 4 enclosed alcohol pads. Let dry.  Apply patch as indicated in monitor instructions. Patch will be placed under collarbone on left side of chest with arrow pointing upward.  Rub patch adhesive wings for 2 minutes. Remove white label marked "1". Remove the white label marked "2". Rub patch adhesive wings for 2 additional minutes.  While looking in a mirror, press and release button in center of patch. A small green light will flash 3-4 times. This will be your only indicator that the monitor has been turned on. ?  Do not shower for the first 24 hours. You may shower after the first 24 hours.  Press the button if you feel a symptom. You will hear a  small click. Record Date, Time and Symptom in the Patient Logbook.  When you are ready to remove the patch, follow instructions on the last 2 pages of the Patient Logbook. Stick patch monitor onto the last page of Patient Logbook.  Place Patient Logbook in the blue and white box.  Use locking tab on box and tape box closed securely.  The blue and  white box has prepaid postage on it. Please place it in the mailbox as soon as possible. Your physician should have your test results approximately 7 days after the monitor has been mailed back to [facility].  Call [facility] Customer Care at [phone] if you have questions regarding your ZIO XT patch monitor. Call them immediately if you see an orange light blinking on your monitor.  If your monitor falls off in less than 4 days, contact our Monitor department at [phone]. ?If your monitor becomes loose or falls off after 4 days call [employer] at [phone] for suggestions on securing your monitor.?   Follow-Up: At [facility], you and your health needs are our priority.  As part of our continuing mission to provide you with exceptional heart care, we have created designated Provider Care Teams.  These Care Teams include your primary Cardiologist (physician) and Advanced Practice Providers (APPs -  Physician Assistants and Nurse Practitioners) who all work together to provide you with the care you need, when you need it.  We recommend signing up for the patient portal called "MyChart".  Sign up information is provided on this After Visit Summary.  MyChart is used to connect with patients for Virtual Visits (Telemedicine).  Patients are able to view lab/test results, encounter notes, upcoming appointments, etc.  Non-urgent messages can be sent to your provider as well.   To learn more about what you can do with MyChart, go to [email or web address].    Your next appointment:   6 month(s)  The format for your next appointment:   In Person  Provider:   Dr. Bjorn Pippin

## 2022-10-22 NOTE — Progress Notes (Signed)
Cardiology Office Note:    Date:  10/22/2022   ID:  Victoria Dunn, DOB [birth year], MRN [id number]  PCP:  Deeann Saint, MD  Cardiologist:  None  Electrophysiologist:  None   Referring MD: Deeann Saint, MD   Chief Complaint  Patient presents with   Palpitations    History of Present Illness:    Victoria Dunn is a 21 y.o. female with no PMHx who is referred by Dr Salomon Fick for evaluation of tachycardia.  She reports that her heart rate runs 110s to 130s.  She has palpitations at least once per day where she feels like her heart is racing.  Lasts for about 10 minutes or so.  She denies any chest pain or dyspnea but reports has been having lightheadedness with exertion.  States that when riding exercise bike recently felt like she was going to pass out.  Denies any syncopal episodes.  Denies any lower extremity edema.  No smoking history.  Drinks maybe 1 cup of coffee per week.  Family history includes father (who is a patient of mine) had MI in 101s.  Past Medical History:  Diagnosis Date   Angio-edema    Urticaria     Past Surgical History:  Procedure Laterality Date   FOOT SURGERY Bilateral 10/2018    Current Medications: Current Meds  Medication Sig   drospirenone-ethinyl estradiol (YAZ) 3-0.02 MG tablet Take 1 tablet by mouth daily.   EPINEPHrine 0.3 mg/0.3 mL IJ SOAJ injection INJECT 0.3 MLS INTO THE MUSCLE ONCE FOR 1 DOSE.   famotidine (PEPCID) 20 MG tablet Take 1 tablet (20 mg total) by mouth 2 (two) times daily.   Current Facility-Administered Medications for the 10/22/22 encounter (Office Visit) with Little Ishikawa, MD  Medication   betamethasone acetate-betamethasone sodium phosphate (CELESTONE) injection 3 mg     Allergies:   Soy allergy   Social History   Socioeconomic History   Marital status: Single    Spouse name: Not on file   Number of children: Not on file   Years of education: Not on file   Highest education level: Not on file   Occupational History   Not on file  Tobacco Use   Smoking status: Never   Smokeless tobacco: Never  Vaping Use   Vaping Use: Not on file  Substance and Sexual Activity   Alcohol use: No   Drug use: No   Sexual activity: Never    Birth control/protection: Pill  Other Topics Concern   Not on file  Social History Narrative   Lives with both parents and younger sister in the same household.   Mother is an Char[employer] fundraiser.   Social Determinants of Health   Financial Resource Strain: Not on file  Food Insecurity: Not on file  Transportation Needs: Not on file  Physical Activity: Not on file  Stress: Not on file  Social Connections: Not on file     Family History: The patient's family history includes Allergies in her mother; Food Allergy in her mother.  ROS:   Please see the history of present illness.     All other systems reviewed and are negative.  EKGs/Labs/Other Studies Reviewed:    The following studies were reviewed today:   EKG:   10/22/2022: Sinus tachycardia, rate 101, no ST abnormality  Recent Labs: 09/09/2022: ALT 24; BUN 10; Creatinine, Ser 0.68; Hemoglobin 13.1; Platelets 280.0; Potassium 4.2; Sodium 135; TSH 2.06  Recent Lipid Panel No results found for: "CHOL", "TRIG", "HDL", "  CHOLHDL", "VLDL", "LDLCALC", "LDLDIRECT"  Physical Exam:    VS:  BP 118/66 (BP Location: Left Arm, Patient Position: Sitting, Cuff Size: Normal)   Pulse (!) 101   Ht 5\' 3"  (1.6 m)   Wt 159 lb 14.4 oz (72.5 kg)   SpO2 99%   BMI 28.33 kg/m     Wt Readings from Last 3 Encounters:  10/22/22 159 lb 14.4 oz (72.5 kg) (87 %, Z= 1.14)*  06/04/22 153 lb 3.2 oz (69.5 kg) (84 %, Z= 0.98)*  03/25/22 153 lb 12.8 oz (69.8 kg) (84 %, Z= 1.01)*   * Growth percentiles are based on CDC (Girls, 2-20 Years) data.     GEN:  Well nourished, well developed in no acute distress HEENT: Normal NECK: No JVD; No carotid bruits LYMPHATICS: No lymphadenopathy CARDIAC: tachycardic, regular, no murmurs,  rubs, gallops RESPIRATORY:  Clear to auscultation without rales, wheezing or rhonchi  ABDOMEN: Soft, non-tender, non-distended MUSCULOSKELETAL:  No edema; No deformity  SKIN: Warm and dry NEUROLOGIC:  Alert and oriented x 3 PSYCHIATRIC:  Normal affect   ASSESSMENT:    1. Palpitations   2. Abnormal EKG   3. Near syncope   4. Lightheadedness    PLAN:    Palpitations/tachycardia: Reports heart rate runs 110s to 130s.  Also having intermittent palpitations concerning for arrhythmia.  Recommend Zio patch x2 weeks.  Recommend echocardiogram to rule out structural heart disease.  Near syncope: Reports feeling lightheaded with exertion, had recent near syncopal episode while riding exercise bike.  Recommend echocardiogram as above.  Recommend ETT  RTC in 6 months  Shared Decision Making/Informed Consent The risks [chest pain, shortness of breath, cardiac arrhythmias, dizziness, blood pressure fluctuations, myocardial infarction, stroke/transient ischemic attack, and life-threatening complications (estimated to be 1 in 10,000)], benefits (risk stratification, diagnosing coronary artery disease, treatment guidance) and alternatives of an exercise tolerance test were discussed in detail with Ms. Klassen and she agrees to proceed.    Medication Adjustments/Labs and Tests Ordered: Current medicines are reviewed at length with the patient today.  Concerns regarding medicines are outlined above.  Orders Placed This Encounter  Procedures   LONG TERM MONITOR (3-14 DAYS)   EXERCISE TOLERANCE TEST (ETT)   EKG 12-Lead   ECHOCARDIOGRAM COMPLETE   No orders of the defined types were placed in this encounter.   Patient Instructions  Medication Instructions:  Your physician recommends that you continue on your current medications as directed. Please refer to the Current Medication list given to you today.  *If you need a refill on your cardiac medications before your next appointment, please call  your pharmacy*  Testing/Procedures: Your physician has requested that you have an echocardiogram. Echocardiography is a painless test that uses sound waves to create images of your heart. It provides your doctor with information about the size and shape of your heart and how well your heart's chambers and valves are working. This procedure takes approximately one hour. There are no restrictions for this procedure. Please do NOT wear cologne, perfume, aftershave, or lotions (deodorant is allowed). Please arrive 15 minutes prior to your appointment time.  Your physician has requested that you have an exercise tolerance test. For further information please visit [email or web address]. Please also follow instruction sheet, as given.  ZIO XT- Long Term Monitor Instructions   Your physician has requested you wear a ZIO patch monitor for _7__ days.  This is a single patch monitor.   [employer] supplies one patch monitor per enrollment. Additional stickers are not  available. Please do not apply patch if you will be having a Nuclear Stress Test, Echocardiogram, Cardiac CT, MRI, or Chest Xray during the period you would be wearing the monitor. The patch cannot be worn during these tests. You cannot remove and re-apply the ZIO XT patch monitor.  Your ZIO patch monitor will be sent Fed Ex from [employer] directly to your home address. It may take 3-5 days to receive your monitor after you have been enrolled.  Once you have received your monitor, please review the enclosed instructions. Your monitor has already been registered assigning a specific monitor serial # to you.  Billing and Patient Assistance Program Information   We have supplied [employer] with any of your insurance information on file for billing purposes. [employer] offers a sliding scale Patient Assistance Program for patients that do not have insurance, or whose insurance does not completely cover the cost of the ZIO monitor.   You must apply  for the Patient Assistance Program to qualify for this discounted rate.     To apply, please call [employer] at [phone], select option 4, then select option 2, and ask to apply for Patient Assistance Program.  Meredeth Ide will ask your household income, and how many people are in your household.  They will quote your out-of-pocket cost based on that information.  [employer] will also be able to set up a 56-month, interest-free payment plan if needed.  Applying the monitor   Shave hair from upper left chest.  Hold abrader disc by orange tab. Rub abrader in 40 strokes over the upper left chest as indicated in your monitor instructions.  Clean area with 4 enclosed alcohol pads. Let dry.  Apply patch as indicated in monitor instructions. Patch will be placed under collarbone on left side of chest with arrow pointing upward.  Rub patch adhesive wings for 2 minutes. Remove white label marked "1". Remove the white label marked "2". Rub patch adhesive wings for 2 additional minutes.  While looking in a mirror, press and release button in center of patch. A small green light will flash 3-4 times. This will be your only indicator that the monitor has been turned on. ?  Do not shower for the first 24 hours. You may shower after the first 24 hours.  Press the button if you feel a symptom. You will hear a small click. Record Date, Time and Symptom in the Patient Logbook.  When you are ready to remove the patch, follow instructions on the last 2 pages of the Patient Logbook. Stick patch monitor onto the last page of Patient Logbook.  Place Patient Logbook in the blue and white box.  Use locking tab on box and tape box closed securely.  The blue and white box has prepaid postage on it. Please place it in the mailbox as soon as possible. Your physician should have your test results approximately 7 days after the monitor has been mailed back to [facility].  Call [facility] Customer Care at [phone] if you have  questions regarding your ZIO XT patch monitor. Call them immediately if you see an orange light blinking on your monitor.  If your monitor falls off in less than 4 days, contact our Monitor department at [phone]. ?If your monitor becomes loose or falls off after 4 days call [employer] at [phone] for suggestions on securing your monitor.?   Follow-Up: At [facility], you and your health needs are our priority.  As part of our continuing mission to  provide you with exceptional heart care, we have created designated Provider Care Teams.  These Care Teams include your primary Cardiologist (physician) and Advanced Practice Providers (APPs -  Physician Assistants and Nurse Practitioners) who all work together to provide you with the care you need, when you need it.  We recommend signing up for the patient portal called "MyChart".  Sign up information is provided on this After Visit Summary.  MyChart is used to connect with patients for Virtual Visits (Telemedicine).  Patients are able to view lab/test results, encounter notes, upcoming appointments, etc.  Non-urgent messages can be sent to your provider as well.   To learn more about what you can do with MyChart, go to [email or web address].    Your next appointment:   6 month(s)  The format for your next appointment:   In Person  Provider:   Dr. Bjorn Pippin        Signed, Little Ishikawa, MD  10/22/2022 5:26 PM    [facility]

## 2022-10-22 NOTE — Progress Notes (Unsigned)
Enrolled patient for a 7 day Zio XT monitor to be mailed to patients home.  

## 2022-10-27 DIAGNOSIS — R002 Palpitations: Secondary | ICD-10-CM

## 2022-10-29 ENCOUNTER — Ambulatory Visit: Payer: 59 | Attending: Cardiology

## 2022-10-29 DIAGNOSIS — R42 Dizziness and giddiness: Secondary | ICD-10-CM | POA: Diagnosis not present

## 2022-10-29 DIAGNOSIS — R55 Syncope and collapse: Secondary | ICD-10-CM | POA: Diagnosis not present

## 2022-10-29 LAB — EXERCISE TOLERANCE TEST
Angina Index: 0
Duke Treadmill Score: 9
Estimated workload: 10.1
Exercise duration (min): 9 min
Exercise duration (sec): 0 s
MPHR: 201 {beats}/min
Peak HR: 190 {beats}/min
Percent HR: 94 %
RPE: 17
Rest HR: 103 {beats}/min
ST Depression (mm): 0 mm

## 2022-11-10 ENCOUNTER — Other Ambulatory Visit (HOSPITAL_COMMUNITY): Payer: Self-pay

## 2022-11-10 ENCOUNTER — Other Ambulatory Visit: Payer: Self-pay

## 2022-11-10 ENCOUNTER — Other Ambulatory Visit: Payer: Self-pay | Admitting: Allergy

## 2022-11-10 MED ORDER — FAMOTIDINE 20 MG PO TABS
20.0000 mg | ORAL_TABLET | Freq: Two times a day (BID) | ORAL | 3 refills | Status: DC
  Filled 2022-11-10: qty 60, 30d supply, fill #0
  Filled 2023-02-16 – 2023-04-30 (×2): qty 60, 30d supply, fill #1

## 2022-11-13 ENCOUNTER — Other Ambulatory Visit (HOSPITAL_COMMUNITY): Payer: 59

## 2022-11-13 DIAGNOSIS — R002 Palpitations: Secondary | ICD-10-CM | POA: Diagnosis not present

## 2022-11-14 ENCOUNTER — Encounter (HOSPITAL_BASED_OUTPATIENT_CLINIC_OR_DEPARTMENT_OTHER): Payer: Self-pay

## 2022-11-14 ENCOUNTER — Emergency Department (HOSPITAL_BASED_OUTPATIENT_CLINIC_OR_DEPARTMENT_OTHER)
Admission: EM | Admit: 2022-11-14 | Discharge: 2022-11-14 | Disposition: A | Discharge status: Home / Self Care | Payer: 59 | Attending: Emergency Medicine | Admitting: Emergency Medicine

## 2022-11-14 ENCOUNTER — Other Ambulatory Visit: Payer: Self-pay

## 2022-11-14 ENCOUNTER — Emergency Department (HOSPITAL_BASED_OUTPATIENT_CLINIC_OR_DEPARTMENT_OTHER): Payer: 59

## 2022-11-14 DIAGNOSIS — U071 COVID-19: Secondary | ICD-10-CM | POA: Insufficient documentation

## 2022-11-14 DIAGNOSIS — R791 Abnormal coagulation profile: Secondary | ICD-10-CM | POA: Diagnosis not present

## 2022-11-14 DIAGNOSIS — R000 Tachycardia, unspecified: Secondary | ICD-10-CM

## 2022-11-14 DIAGNOSIS — R0602 Shortness of breath: Secondary | ICD-10-CM | POA: Diagnosis not present

## 2022-11-14 DIAGNOSIS — R059 Cough, unspecified: Secondary | ICD-10-CM | POA: Diagnosis not present

## 2022-11-14 DIAGNOSIS — R7989 Other specified abnormal findings of blood chemistry: Secondary | ICD-10-CM | POA: Diagnosis not present

## 2022-11-14 HISTORY — DX: Tachycardia, unspecified: R00.0

## 2022-11-14 LAB — COMPREHENSIVE METABOLIC PANEL
ALT: 17 U/L (ref 0–44)
AST: 17 U/L (ref 15–41)
Albumin: 4.2 g/dL (ref 3.5–5.0)
Alkaline Phosphatase: 62 U/L (ref 38–126)
Anion gap: 13 (ref 5–15)
BUN: 7 mg/dL (ref 6–20)
CO2: 24 mmol/L (ref 22–32)
Calcium: 9.6 mg/dL (ref 8.9–10.3)
Chloride: 102 mmol/L (ref 98–111)
Creatinine, Ser: 0.68 mg/dL (ref 0.44–1.00)
GFR, Estimated: >60 mL/min (ref >60)
Glucose, Bld: 106 mg/dL — ABNORMAL HIGH (ref 70–99)
Potassium: 3.9 mmol/L (ref 3.5–5.1)
Sodium: 139 mmol/L (ref 135–145)
Total Bilirubin: 0.6 mg/dL (ref 0.3–1.2)
Total Protein: 7.9 g/dL (ref 6.5–8.1)

## 2022-11-14 LAB — CBC WITH DIFFERENTIAL/PLATELET
Abs Immature Granulocytes: 0.01 10*3/uL (ref 0.00–0.07)
Basophils Absolute: 0 10*3/uL (ref 0.0–0.1)
Basophils Relative: 1 %
Eosinophils Absolute: 0.1 10*3/uL (ref 0.0–0.5)
Eosinophils Relative: 2 %
HCT: 42.2 % (ref 36.0–46.0)
Hemoglobin: 13.6 g/dL (ref 12.0–15.0)
Immature Granulocytes: 0 %
Lymphocytes Relative: 30 %
Lymphs Abs: 1.8 10*3/uL (ref 0.7–4.0)
MCH: 27.4 pg (ref 26.0–34.0)
MCHC: 32.2 g/dL (ref 30.0–36.0)
MCV: 84.9 fL (ref 80.0–100.0)
Monocytes Absolute: 0.7 10*3/uL (ref 0.1–1.0)
Monocytes Relative: 12 %
Neutro Abs: 3.5 10*3/uL (ref 1.7–7.7)
Neutrophils Relative %: 55 %
Platelets: 312 10*3/uL (ref 150–400)
RBC: 4.97 MIL/uL (ref 3.87–5.11)
RDW: 12.8 % (ref 11.5–15.5)
WBC: 6.2 10*3/uL (ref 4.0–10.5)
nRBC: 0 % (ref 0.0–0.2)

## 2022-11-14 LAB — TROPONIN I (HIGH SENSITIVITY): Troponin I (High Sensitivity): <2 ng/L (ref <18)

## 2022-11-14 LAB — HCG, QUANTITATIVE, PREGNANCY: hCG, Beta Chain, Quant, S: <1 m[IU]/mL (ref <5)

## 2022-11-14 LAB — D-DIMER, QUANTITATIVE: D-Dimer, Quant: 0.55 ug/mL-FEU — ABNORMAL HIGH (ref 0.00–0.50)

## 2022-11-14 MED ORDER — LACTATED RINGERS IV BOLUS
2000.0000 mL | Freq: Once | INTRAVENOUS | Status: AC
  Administered 2022-11-14: 2000 mL via INTRAVENOUS

## 2022-11-14 MED ORDER — ACETAMINOPHEN 500 MG PO TABS
1000.0000 mg | ORAL_TABLET | Freq: Once | ORAL | Status: AC
  Administered 2022-11-14: 1000 mg via ORAL
  Filled 2022-11-14: qty 2

## 2022-11-14 MED ORDER — IOHEXOL 350 MG/ML SOLN
100.0000 mL | Freq: Once | INTRAVENOUS | Status: AC | PRN
  Administered 2022-11-14: 100 mL via INTRAVENOUS

## 2022-11-14 MED ORDER — KETOROLAC TROMETHAMINE 15 MG/ML IJ SOLN
15.0000 mg | Freq: Once | INTRAMUSCULAR | Status: AC
  Administered 2022-11-14: 15 mg via INTRAVENOUS
  Filled 2022-11-14: qty 1

## 2022-11-14 MED ORDER — ONDANSETRON HCL 4 MG/2ML IJ SOLN
4.0000 mg | Freq: Once | INTRAMUSCULAR | Status: AC
  Administered 2022-11-14: 4 mg via INTRAVENOUS
  Filled 2022-11-14: qty 2

## 2022-11-14 MED ORDER — DEXAMETHASONE SODIUM PHOSPHATE 10 MG/ML IJ SOLN
10.0000 mg | Freq: Once | INTRAMUSCULAR | Status: AC
  Administered 2022-11-14: 10 mg via INTRAVENOUS
  Filled 2022-11-14: qty 1

## 2022-11-14 NOTE — ED Notes (Signed)
PATIENT AND MOTHER REFUSED RESP PANEL TESTING

## 2022-11-14 NOTE — Discharge Instructions (Addendum)
You have been seen in the Emergency Department (ED)  today for symptoms related to COVID.  Overall your workup was reassuring.  You showed no signs of severe dehydration, your CT scan showed no pneumonia or blood clot and your cardiac enzymes were normal.  Your EKG showed sinus tachycardia.  You were given IV fluids, IV steroids and IV anti-inflammatory medications with improvement in your heart rate.  Continue to drink plenty of fluids and take Tylenol and ibuprofen for fever and pain.  Return to the Emergency Department (ED)  if you have worsening trouble breathing, chest pain, fainting, difficulty swallowing, neck pain or stiffness or other symptoms that concern you.  Please make an appointment to follow up with your primary care doctor within one week to assure improvement or resolution in symptoms.  Also discussed with your cardiologist any further management of your tachycardia.

## 2022-11-14 NOTE — ED Provider Notes (Signed)
[facility] EMERGENCY DEPT Provider Note   CSN: [id number] Arrival date & time: 11/14/22  0744     History  Chief Complaint  Patient presents with   Flu sx    Victoria Dunn is a 21 y.o. female.  With PMH of sinus tachycardia who presents COVID-positive since yesterday with shortness of breath and rapid heart rate.  Patient notes having ongoing symptoms for the past couple of days.  She has been having congestion, rhinorrhea, fever, dry cough and shortness of breath worse on ambulation.  She has had no leg pain or swelling, no chest pain.  She has no history of asthma or any lung disease.  She is being worked up by cardiology for sinus tachycardia and had a recent stress test performed earlier this month which was normal as well as a Holter monitor which showed sinus tachycardia.  She has no history of PE or DVT.  Her sister also has COVID but is doing much better than she is.  She only took ibuprofen this morning.  She has not eaten or drank much today.  She has had no vomiting, diarrhea or other GI symptoms.  HPI     Home Medications Prior to Admission medications   Medication Sig Start Date End Date Taking? Authorizing Provider  Azelastine-Fluticasone 137-50 MCG/ACT SUSP Place 1 spray into the nose 2 (two) times daily. 03/13/21   Marcelyn Bruins, MD  diclofenac (VOLTAREN) 75 MG EC tablet Take 1 tablet (75 mg total) by mouth 2 (two) times daily. 08/27/22   [name], DPM  drospirenone-ethinyl estradiol (YAZ) 3-0.02 MG tablet TAKE 1 TABLET EVERY DAY BY MOUTH 11/27/20 12/25/21  Silverio Lay, MD  drospirenone-ethinyl estradiol (YAZ) 3-0.02 MG tablet Take 1 tablet by mouth daily. 06/16/22     EPINEPHrine 0.3 mg/0.3 mL IJ SOAJ injection INJECT 0.3 MLS INTO THE MUSCLE ONCE FOR 1 DOSE. 03/19/22   Marcelyn Bruins, MD  famotidine (PEPCID) 20 MG tablet Take 1 tablet (20 mg total) by mouth 2 (two) times daily. 11/10/22   Marcelyn Bruins, MD   Olopatadine-Mometasone Cristal Generous) [phone] MCG/ACT SUSP Place 1-2 spray in each nostril twice a day for nasal congestion or drainage 03/19/22   Marcelyn Bruins, MD  ondansetron (ZOFRAN) 4 MG tablet Take 1 tablet (4 mg total) by mouth every 8 (eight) hours as needed for nausea or vomiting. 03/25/22   Scheeler, Kermit Balo, PA-C  oxyCODONE-acetaminophen (PERCOCET) 5-325 MG tablet Take 1 tablet by mouth every 4 (four) hours as needed for severe pain. 08/27/22   [name], DPM      Allergies    Soy allergy    Review of Systems   Review of Systems  Physical Exam Updated Vital Signs BP 128/83   Pulse (!) 110   Temp 98.6 F (37 C) (Oral)   Resp 20   LMP 10/20/2022 (Approximate)   SpO2 100%  Physical Exam Constitutional: Alert and oriented.  No acute distress and nontoxic Eyes: Conjunctivae are normal. ENT      Head: Normocephalic and atraumatic.      Nose: + congestion.      Neck: No stridor.  No drooling.  No tripoding. Cardiovascular: S1, S2, tachycardic, regular rhythm.Warm and well perfused. Respiratory: Normal respiratory effort. Breath sounds are normal.  O2 sat 100 on RA. Gastrointestinal: Soft and nontender.  Musculoskeletal: Normal range of motion in all extremities.      Right lower leg: No tenderness or edema.  Left lower leg: No tenderness or edema. Neurologic: Normal speech and language.  Moving all extremities equally.  Sensation grossly intact.  No gross focal neurologic deficits are appreciated. Skin: Skin is warm, dry and intact. No rash noted. Psychiatric: Mood and affect are normal. Speech and behavior are normal.  ED Results / Procedures / Treatments   Labs (all labs ordered are listed, but only abnormal results are displayed) Labs Reviewed  COMPREHENSIVE METABOLIC PANEL - Abnormal; Notable for the following components:      Result Value   Glucose, Bld 106 (*)    All other components within normal limits  D-DIMER, QUANTITATIVE - Abnormal; Notable  for the following components:   D-Dimer, Quant 0.55 (*)    All other components within normal limits  RESP PANEL BY RT-PCR (RSV, FLU A&B, COVID)  [id number]  CBC WITH DIFFERENTIAL/PLATELET  HCG, QUANTITATIVE, PREGNANCY  TROPONIN I (HIGH SENSITIVITY)    EKG EKG Interpretation  Date/Time:  Saturday November 14 2022 09:16:17 EST Ventricular Rate:  115 PR Interval:  149 QRS Duration: 71 QT Interval:  317 QTC Calculation: 439 R Axis:   50 Text Interpretation: Sinus tachycardia Confirmed by Georgina Snell [phone]) on 11/14/2022 10:44:37 AM  Radiology CT Angio Chest PE W and/or Wo Contrast  Result Date: 11/14/2022 CLINICAL DATA:  Shortness of breath, positive D-dimer. EXAM: CT ANGIOGRAPHY CHEST WITH CONTRAST TECHNIQUE: Multidetector CT imaging of the chest was performed using the standard protocol during bolus administration of intravenous contrast. Multiplanar CT image reconstructions and MIPs were obtained to evaluate the vascular anatomy. RADIATION DOSE REDUCTION: This exam was performed according to the departmental dose-optimization program which includes automated exposure control, adjustment of the mA and/or kV according to patient size and/or use of iterative reconstruction technique. CONTRAST:  184mL OMNIPAQUE IOHEXOL 350 MG/ML SOLN COMPARISON:  Same day chest radiograph FINDINGS: Cardiovascular: There is adequate opacification of the pulmonary arteries to the segmental level. There is no evidence of pulmonary embolism. The heart size is normal. There is no pericardial effusion. The thoracic aorta is normal. Mediastinum/Nodes: The thyroid is unremarkable. The esophagus is grossly unremarkable. There is no mediastinal, hilar, or axillary lymphadenopathy. Lungs/Pleura: The trachea and central airways are patent. The lungs are clear, with no focal consolidation or pulmonary edema. There is no pleural effusion or pneumothorax. There are no suspicious nodules. Upper Abdomen: The imaged portions  of the upper abdominal viscera are unremarkable. Musculoskeletal: There is no acute osseous abnormality or suspicious osseous lesion. Review of the MIP images confirms the above findings. IMPRESSION: No evidence of pulmonary embolism or other acute cardiopulmonary pathology. Electronically Signed   By: Valetta Mole M.D.   On: 11/14/2022 12:18   DG Chest Portable 1 View  Result Date: 11/14/2022 CLINICAL DATA:  Shortness of breath.  COVID.  Cough and congestion. EXAM: PORTABLE CHEST 1 VIEW COMPARISON:  None Available. FINDINGS: The heart size and mediastinal contours are within normal limits. Both lungs are clear. The visualized skeletal structures are unremarkable. IMPRESSION: No active disease. Electronically Signed   By: Kerby Moors M.D.   On: 11/14/2022 09:30    Procedures Procedures  Remain on constant cardiac monitoring show personally reviewed, sinus tachycardia.  Medications Ordered in ED Medications  lactated ringers bolus 2,000 mL (0 mLs Intravenous Stopped 11/14/22 1140)  ketorolac (TORADOL) 15 MG/ML injection 15 mg (15 mg Intravenous Given 11/14/22 1010)  dexamethasone (DECADRON) injection 10 mg (10 mg Intravenous Given 11/14/22 1008)  acetaminophen (TYLENOL) tablet 1,000 mg (1,000 mg Oral Given 11/14/22  1029)  ondansetron (ZOFRAN) injection 4 mg (4 mg Intravenous Given 11/14/22 1049)  iohexol (OMNIPAQUE) 350 MG/ML injection 100 mL (100 mLs Intravenous Contrast Given 11/14/22 1212)    ED Course/ Medical Decision Making/ A&P Clinical Course as of 11/14/22 1309  Sat Nov 14, 2022  1044 Patient eating meal right now in no acute distress heart rate has improved most recent 110 with stable vital signs. [VB]  [id number] Reassessed patient heart rate now 105 at rest.  She is in no acute distress and she notes that symptoms have significantly improved.  CTA PE study negative.  Advise close follow-up with cardiologist and PCP regarding ongoing sinus tachycardia but otherwise no acute findings  today on exam requiring admission or further workup at this time. [VB]    Clinical Course User Index [VB] Elgie Congo, MD                           Medical Decision Making Aftin NEREA BETTINGER is a 21 y.o. female.  With PMH of sinus tachycardia who presents COVID-positive since yesterday with shortness of breath and rapid heart rate.   Patient presents febrile 38.1 C with tachycardia 133 otherwise hemodynamically stable satting 100% on room air with no increased work of breathing.  Her symptoms are likely secondary to COVID infection which is known and tested positive at home however consider possible bacterial or viral pneumonia or possible PE although low risk Wells and no signs of DVT on exam.  EKG reviewed which showed sinus tachycardia, no evidence of WPW, no QTc abnormality, no ST/T changes concerning for ischemia.  Troponin ordered which showed undetected level, no concern for ischemia or myocarditis.  Labs were obtained to further evaluate for any evidence of dehydration or anemia contributing to tachycardia.  Labs reviewed which showed no anemia hemoglobin 13.6, white blood cell count 6.2, creatinine 0.68 within normal limits, no acute electrolyte abnormalities.  D-dimer was +0.55 likely secondary to COVID.  Chest x-ray obtained which I personally reviewed which showed no consolidation concerning for pneumonia, no pneumothorax, no pleural effusion.  CTA PE study performed due to elevated D-dimer which was negative for PE or other acute cardiopulmonary pathology.  Patient sinus tachycardia improved to 105 at rest on my exam.  She is otherwise hemodynamically stable and improved after IV fluids, Decadron, Toradol and Tylenol.  Advised continued supportive care and close follow-up with PCP and cardiologist regarding her sinus Tachycardia.  Strict return precaution discussed.  Clinically safe for discharge at this time.  Amount and/or Complexity of Data Reviewed Labs: ordered. Radiology:  ordered.  Risk OTC drugs. Prescription drug management.    Final Clinical Impression(s) / ED Diagnoses Final diagnoses:  COVID-19  Sinus tachycardia    Rx / DC Orders ED Discharge Orders     None         Elgie Congo, MD 11/14/22 1310

## 2022-11-14 NOTE — ED Notes (Signed)
PER CT, PATIENT CANNOT HAVE SCAN DONE FOR 45 MINS TO 1 HOUR DUE TO PATIENT EATING.

## 2022-11-14 NOTE — ED Triage Notes (Signed)
She c/o congested cough since yesterday. Her mom states she had a home COVID test today which was "positive". She further tells me that pt. Has had issues and hx of tachycardia and has impending app't. For 2 D echo. Pt. Is in no distress and is ambulatory.

## 2022-11-24 ENCOUNTER — Other Ambulatory Visit: Payer: Self-pay

## 2022-11-27 ENCOUNTER — Other Ambulatory Visit (HOSPITAL_COMMUNITY): Payer: Self-pay

## 2022-12-15 ENCOUNTER — Other Ambulatory Visit (HOSPITAL_COMMUNITY): Payer: Self-pay

## 2022-12-16 ENCOUNTER — Encounter: Payer: Self-pay | Admitting: Podiatry

## 2023-01-01 ENCOUNTER — Ambulatory Visit (HOSPITAL_COMMUNITY): Payer: Commercial Managed Care - PPO | Attending: Cardiology

## 2023-01-01 DIAGNOSIS — R9431 Abnormal electrocardiogram [ECG] [EKG]: Secondary | ICD-10-CM | POA: Diagnosis not present

## 2023-01-01 DIAGNOSIS — I517 Cardiomegaly: Secondary | ICD-10-CM

## 2023-01-01 LAB — ECHOCARDIOGRAM COMPLETE
Area-P 1/2: 5.68 cm2
S' Lateral: 1.7 cm

## 2023-01-04 NOTE — Telephone Encounter (Signed)
Hey Ammie, I'm a little late getting back to this patient.  She is hoping to have an MRI in [place], [place].  I do not know if we can order an MRI to another hospital?  If not I would recommend she can follow up with podiatry in [place]. Best is Dr. Joelene Millin or Shary Decamp with [facility] in [place]. Thanks, Dr. Amalia Hailey

## 2023-01-08 ENCOUNTER — Ambulatory Visit (INDEPENDENT_AMBULATORY_CARE_PROVIDER_SITE_OTHER): Payer: Commercial Managed Care - PPO | Admitting: Podiatry

## 2023-01-08 DIAGNOSIS — M67472 Ganglion, left ankle and foot: Secondary | ICD-10-CM | POA: Diagnosis not present

## 2023-01-11 DIAGNOSIS — M67472 Ganglion, left ankle and foot: Secondary | ICD-10-CM | POA: Diagnosis not present

## 2023-01-11 MED ORDER — BETAMETHASONE SOD PHOS & ACET 6 (3-3) MG/ML IJ SUSP
3.0000 mg | Freq: Once | INTRAMUSCULAR | Status: AC
  Administered 2023-01-11: 3 mg via INTRA_ARTICULAR

## 2023-01-11 NOTE — Progress Notes (Signed)
   Chief Complaint  Patient presents with   soft tissue    Left foot GANGLION CYST, top of the foot, started back a month ago, patient has pain when wearing shoes, rubs against her shoes, rate of pain 6 out of 10,     HPI: 22 y.o. female presenting today for what appears to be recurrence of the ganglion cystic lesion to the dorsum of the left foot.  Patient states that the ganglion cyst recurred about 1 month ago.  It rubs in her shoes.  Currently the pain is 6/10.  She does have a history of surgical excision of a ganglion cyst to this exact same area 08/27/2022.  Patient presents for further treatment and evaluation of  Past Medical History:  Diagnosis Date   Angio-edema    Tachycardia    Urticaria     Past Surgical History:  Procedure Laterality Date   FOOT SURGERY Bilateral 10/2018    Allergies  Allergen Reactions   Soy Allergy Swelling    Soy, mangoes, cherries-tongue and lip edema.  Has epi pen.     Physical Exam: General: The patient is alert and oriented x3 in no acute distress.  Dermatology: Skin is warm, dry and supple bilateral lower extremities. Negative for open lesions or macerations.  Vascular: Palpable pedal pulses bilaterally. Capillary refill within normal limits.  Negative for any significant edema or erythema  Neurological: Light touch and protective threshold grossly intact  Musculoskeletal Exam: No pedal deformities noted.  There is a nonadherent fluctuant lesion to the dorsum of the left foot.  This appears somewhat deeper and not so superficial.  Associated tenderness with palpation.  Findings are consistent with a deeper underlying ganglion cyst   Assessment: 1.  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  Unfortunately she has had a ganglion cyst removed from the same area on 08/27/2022 with good healing.  For now we are going to pursue  conservative treatment. -Injection of 0.5 cc Celestone Soluspan injected along the ganglion cyst lesion.  The lesion was unable to be aspirated or drained since it was deeper and not as well-defined as some other ganglion cysts -Recommend compression daily.  Compression socks dispensed -If the lesion does not improve or stays persistent I would recommend MRI prior to revisional excision of the ganglion cystic lesion.  Patient and family agree.  MRI will need to be out of the region around the [place] area -Patient will reach out to me via MyChart if there is improvement or if she continues to have pain and we need to order an MRI -For now return to clinic as needed      Edrick Kins, DPM [facility]  Dr. Edrick Kins, DPM    [address], [place] [address]                Office [phone]  Fax [phone]

## 2023-02-16 ENCOUNTER — Encounter (HOSPITAL_COMMUNITY): Payer: Self-pay

## 2023-02-16 ENCOUNTER — Other Ambulatory Visit: Payer: Self-pay

## 2023-02-16 ENCOUNTER — Other Ambulatory Visit (HOSPITAL_COMMUNITY): Payer: Self-pay

## 2023-02-19 ENCOUNTER — Other Ambulatory Visit: Payer: Self-pay

## 2023-02-22 ENCOUNTER — Other Ambulatory Visit: Payer: Self-pay

## 2023-03-24 ENCOUNTER — Other Ambulatory Visit (HOSPITAL_COMMUNITY): Payer: Self-pay

## 2023-03-24 ENCOUNTER — Ambulatory Visit: Payer: Commercial Managed Care - PPO | Admitting: Allergy

## 2023-03-24 ENCOUNTER — Other Ambulatory Visit: Payer: Self-pay

## 2023-03-24 ENCOUNTER — Encounter: Payer: Self-pay | Admitting: Allergy

## 2023-03-24 VITALS — BP 130/78 | HR 100 | Temp 98.5°F | Resp 16 | Ht 63.0 in | Wt 170.3 lb

## 2023-03-24 DIAGNOSIS — L2481 Irritant contact dermatitis due to metals: Secondary | ICD-10-CM | POA: Diagnosis not present

## 2023-03-24 DIAGNOSIS — H1013 Acute atopic conjunctivitis, bilateral: Secondary | ICD-10-CM | POA: Diagnosis not present

## 2023-03-24 DIAGNOSIS — T7800XA Anaphylactic reaction due to unspecified food, initial encounter: Secondary | ICD-10-CM

## 2023-03-24 DIAGNOSIS — T783XXD Angioneurotic edema, subsequent encounter: Secondary | ICD-10-CM

## 2023-03-24 DIAGNOSIS — J3089 Other allergic rhinitis: Secondary | ICD-10-CM | POA: Diagnosis not present

## 2023-03-24 DIAGNOSIS — L508 Other urticaria: Secondary | ICD-10-CM | POA: Diagnosis not present

## 2023-03-24 MED ORDER — RYALTRIS 665-25 MCG/ACT NA SUSP: NASAL | 5 refills | Status: DC

## 2023-03-24 MED ORDER — EPINEPHRINE 0.3 MG/0.3ML IJ SOAJ
0.3000 mg | INTRAMUSCULAR | 1 refills | Status: AC
  Filled 2023-03-24: qty 2, 2d supply, fill #0

## 2023-03-24 NOTE — Progress Notes (Unsigned)
Follow-up Note  RE: Victoria Dunn MRN: [id number] DOB: [birth year] Date of Office Visit: 03/24/2023   History of present illness: Victoria Dunn is a 22 y.o. female presenting today for follow-up of allergic rhinitis with conjunctivitis, food allergy and hives.  She was last seen in the office on 03/19/2022 by myself.  She presents today with her parents. She also discussed allergy shots today.  She states her allergies have been bad this spring.  She states she has a lot of sneezing, throat clearing, coughing. She reports more stuffy nose and itchy eyes as well.  She is taking zyrtec daily and it does help to some degree.  Mother states when she had the Ryaltris nasal nasal spray that was the most effective nasal spray but she does not have this anymore and would like a refill. Mom also thinks she has a minimal allergy as she has had reactions to earrings which she has had swelling and bruising and crusting after wearing a earrings.  At this time not reporting any episodes of hives.  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  This could be a hive inducing activity. She continues to avoid soy, peach, cherry and mango foods.  She has access to an epinephrine device   Review of systems: Review of Systems  Constitutional: Negative.   HENT:         See HPI  Eyes: Negative.   Respiratory: Negative.    Cardiovascular: Negative.   Gastrointestinal: Negative.   Musculoskeletal: Negative.   Skin: Negative.   Allergic/Immunologic: Negative.   Neurological: Negative.      All other systems negative unless noted above in HPI  Past medical/social/surgical/family history have been reviewed and are unchanged unless specifically indicated below.  No changes  Medication List: Current Outpatient Medications  Medication Sig Dispense Refill   Azelastine-Fluticasone 137-50 MCG/ACT SUSP Place 1  spray into the nose 2 (two) times daily. 23 g 0   drospirenone-ethinyl estradiol (YAZ) 3-0.02 MG tablet Take 1 tablet by mouth daily. 84 tablet 4   EPINEPHrine 0.3 mg/0.3 mL IJ SOAJ injection INJECT 0.3 MLS INTO THE MUSCLE ONCE FOR 1 DOSE. 2 each 1   famotidine (PEPCID) 20 MG tablet Take 1 tablet (20 mg total) by mouth 2 (two) times daily. 60 tablet 3   diclofenac (VOLTAREN) 75 MG EC tablet Take 1 tablet (75 mg total) by mouth 2 (two) times daily. (Patient not taking: Reported on 03/24/2023) 60 tablet 1   drospirenone-ethinyl estradiol (YAZ) 3-0.02 MG tablet TAKE 1 TABLET EVERY DAY BY MOUTH 84 tablet 4   Olopatadine-Mometasone (RYALTRIS) 665-25 MCG/ACT SUSP Place 1-2 spray in each nostril twice a day for nasal congestion or drainage (Patient not taking: Reported on 03/24/2023) 29 g 2   ondansetron (ZOFRAN) 4 MG tablet Take 1 tablet (4 mg total) by mouth every 8 (eight) hours as needed for nausea or vomiting. (Patient not taking: Reported on 03/24/2023) 20 tablet 0   oxyCODONE-acetaminophen (PERCOCET) 5-325 MG tablet Take 1 tablet by mouth every 4 (four) hours as needed for severe pain. 20 tablet 0   Current Facility-Administered Medications  Medication Dose Route Frequency Provider Last Rate Last Admin   betamethasone acetate-betamethasone sodium phosphate (CELESTONE) injection 3 mg  3 mg Intra-articular Once [name], DPM         Known medication allergies: Allergies  Allergen Reactions  Soy Allergy Swelling    Soy, mangoes, cherries-tongue and lip edema.  Has epi pen.     Physical examination: Blood pressure 130/78, pulse 100, temperature 98.5 F (36.9 C), temperature source Temporal, resp. rate 16, height 5\' 3"  (1.6 m), weight 170 lb 4.8 oz (77.2 kg), SpO2 99 %.  General: Alert, interactive, in no acute distress. HEENT: PERRLA, TMs pearly gray, turbinates moderately edematous with clear discharge, post-pharynx non erythematous. Neck: Supple without lymphadenopathy. Lungs: Clear to  auscultation without wheezing, rhonchi or rales. {no increased work of breathing. CV: Normal S1, S2 without murmurs. Abdomen: Nondistended, nontender. Skin: Warm and dry, without lesions or rashes. Extremities:  No clubbing, cyanosis or edema. Neuro:   Grossly intact.  Diagnositics/Labs: None today  Assessment and plan:   Hives/Swelling (urticaria/angiodema) Take Zyrtec or Xyzal 1 tab daily with Pepcid 20mg  1 tab twice a day at this time.    Allergic rhinitis Continue allergen avoidance measures directed toward pollen, dust mite, cat, dog, and cockroach as listed below Continue Zyrtec 10 mg or Xyzal 5mg  daily use Resume Ryaltris 1-2 spray in each nostril twice a day for nasal congestion or drainage Consider saline nasal rinses as needed for nasal symptoms. Use this before any medicated nasal sprays for best result Consider allergy shots as a means of long-term control.  Allergy shots "re-train" and "reset" the immune system to ignore environmental allergens and decrease the resulting immune response to those allergens (sneezing, itchy watery eyes, runny nose, nasal congestion, etc).  Allergy shots improve symptoms in 80-85% of patients. We have discussed [facility] protocol way of starting allergy injections.  Our [facility] nurse will call to schedule and review pre-medication regimen.   Allergic conjunctivitis Some over the counter eye drops include Pataday one drop in each eye once a day as needed for red, itchy eyes OR Zaditor one drop in each eye twice a day as needed for red itchy eyes.  Foods Continue to avoid soy, peach, cherry, and mango.  In case of an allergic reaction, take Benadryl 50 mg every 4 hours, and if life-threatening symptoms occur, inject with EpiPen 0.3 mg.  Contact dermatitis - patch testing is the test of choice to evaluate for contact dermatitis.  Recommend performing patch testing with the TRUE test patch panels and metal series.  Patches are best placed on a Monday with  return to office on Wednesday and Friday of same week for readings.  Once patches are in place to do not get them wet.  You can take antihistamines while patches are in place.   Follow up in 6 months or sooner if needed.  I appreciate the opportunity to take part in Kylinn's care. Please do not hesitate to contact me with questions.  Sincerely,   Margo Aye, MD Allergy/Immunology [facility]

## 2023-03-24 NOTE — Patient Instructions (Addendum)
Hives/Swelling (urticaria/angiodema) Take Zyrtec or Xyzal 1 tab daily with Pepcid 20mg  1 tab twice a day at this time.    Allergic rhinitis Continue allergen avoidance measures directed toward pollen, dust mite, cat, dog, and cockroach as listed below Continue cetirizine 10 mg or Xyzal 5mg  once a day as needed for runny nose or itch Use Ryaltris 1-2 spray in each nostril twice a day for nasal congestion or drainage Consider saline nasal rinses as needed for nasal symptoms. Use this before any medicated nasal sprays for best result Consider allergy shots as a means of long-term control.  Allergy shots "re-train" and "reset" the immune system to ignore environmental allergens and decrease the resulting immune response to those allergens (sneezing, itchy watery eyes, runny nose, nasal congestion, etc).  Allergy shots improve symptoms in 80-85% of patients. We have discussed [facility] protocol way of starting allergy injections.  Our [facility] nurse will call to schedule and review pre-medication regimen.   Allergic conjunctivitis Some over the counter eye drops include Pataday one drop in each eye once a day as needed for red, itchy eyes OR Zaditor one drop in each eye twice a day as needed for red itchy eyes.  Foods Continue to avoid soy, peach, cherry, and mango.  In case of an allergic reaction, take Benadryl 50 mg every 4 hours, and if life-threatening symptoms occur, inject with EpiPen 0.3 mg.  Contact dermatitis - patch testing is the test of choice to evaluate for contact dermatitis.  Recommend performing patch testing with the TRUE test patch panels and metal series.  Patches are best placed on a Monday with return to office on Wednesday and Friday of same week for readings.  Once patches are in place to do not get them wet.  You can take antihistamines while patches are in place.   True Test looks for the following sensitivities:         Follow up in  6 months or sooner if needed.

## 2023-03-30 ENCOUNTER — Other Ambulatory Visit (HOSPITAL_COMMUNITY): Payer: Self-pay

## 2023-03-30 NOTE — Progress Notes (Signed)
Aeroallergen Immunotherapy   Ordering Provider: Dr. Margo Aye   Patient Details  Name: Victoria Dunn  MRN: [id number]  Date of Birth: 13-Jul-2001   Order 2 of 2   Vial Label: Mite, roach   0.3 ml (Volume)  1:20 Concentration -- Cockroach, German  0.5 ml (Volume)   AU Concentration -- Mite Mix (DF 5,000 & DP 5,000)    0.8  ml Extract Subtotal  4.2  ml Diluent  5.0  ml Maintenance Total   Schedule:  [facility] then B  Silver Vial (1:1,000,000): Schedule B (6 doses)  Blue Vial (1:100,000): Schedule B (6 doses)  Yellow Vial (1:10,000): Schedule B (6 doses)  Green Vial (1:1,000): Schedule B (10 doses)  Red Vial (1:100): Schedule A (14 doses)   Special Instructions: [facility] in [place] then 1-2 inj/week in [place]

## 2023-03-30 NOTE — Addendum Note (Signed)
Addended by: Lorrin Mais on: 03/30/2023 11:50 AM   Modules accepted: Orders

## 2023-03-30 NOTE — Progress Notes (Signed)
Aeroallergen Immunotherapy   Ordering Provider: Dr. Margo Aye   Patient Details  Name: Victoria Dunn  MRN: [id number]  Date of Birth: 01/01/2001   Order 1 of 2   Vial Label: Pollen, pet   0.3 ml (Volume)  BAU Concentration -- 7 Grass Mix* 100,000 ([address] [place], [place], [place], Perennial [place], RedTop, Sweet Vernal, Timothy)  0.2 ml (Volume)  1:20 Concentration -- [place]  0.3 ml (Volume)  BAU Concentration -- [place] 10,000  0.2 ml (Volume)  1:20 Concentration -- [place]  0.3 ml (Volume)  1:20 Concentration -- Ragweed Mix  0.5 ml (Volume)  1:20 Concentration -- Weed Mix*  0.5 ml (Volume)  1:20 Concentration -- Eastern 10 Tree Mix (also Sweet Gum)  0.2 ml (Volume)  1:10 Concentration -- Cedar, red  0.2 ml (Volume)  1:10 Concentration -- Pecan Pollen  0.2 ml (Volume)  1:20 Concentration -- Walnut, Black Pollen  0.5 ml (Volume)  1:10 Concentration -- Cat Hair  0.5 ml (Volume)  1:10 Concentration -- Dog Epithelia    3.9  ml Extract Subtotal  1.1  ml Diluent  5.0  ml Maintenance Total   Schedule:  [facility] then B  Silver Vial (1:1,000,000): Schedule B (6 doses)  Blue Vial (1:100,000): Schedule B (6 doses)  Yellow Vial (1:10,000): Schedule B (6 doses)  Green Vial (1:1,000): Schedule B (6 doses)  Red Vial (1:100): Schedule A (14 doses)   Special Instructions: [facility] in [place] then 1-2 inj/week in [place]

## 2023-03-30 NOTE — Progress Notes (Signed)
RX MADE ONLY.

## 2023-03-31 ENCOUNTER — Telehealth: Payer: Self-pay | Admitting: Allergy

## 2023-03-31 ENCOUNTER — Other Ambulatory Visit (HOSPITAL_COMMUNITY): Payer: Self-pay

## 2023-03-31 ENCOUNTER — Other Ambulatory Visit: Payer: Self-pay | Admitting: *Deleted

## 2023-03-31 MED ORDER — RYALTRIS 665-25 MCG/ACT NA SUSP
1.0000 | Freq: Two times a day (BID) | NASAL | 5 refills | Status: DC
  Filled 2023-03-31: qty 29, 30d supply, fill #0

## 2023-03-31 NOTE — Telephone Encounter (Signed)
Called and spoke with the patient and she requested that the Ryaltris be sent to the [facility], I did schedule the patient for patch testing, she will come in on Monday, Wednesday, and then the following Monday for final patch read since we are closed Friday for a staff meeting, I spoke with Dr. Delorse Lek and she was fine with this plan for her patch testing. Ryaltris has been sent in to requested pharmacy, patient verbalized understanding. Per Dr. Delorse Lek patient will do True test and Metals.

## 2023-03-31 NOTE — Telephone Encounter (Addendum)
Patient mom called and said that the Ryaltris nasel spray was not called into [facility] on [address]. Also she has not heard about having metal testing done. [phone].

## 2023-04-01 ENCOUNTER — Other Ambulatory Visit (HOSPITAL_COMMUNITY): Payer: Self-pay

## 2023-04-05 ENCOUNTER — Other Ambulatory Visit: Payer: Self-pay

## 2023-04-05 ENCOUNTER — Encounter: Payer: Self-pay | Admitting: Family Medicine

## 2023-04-05 ENCOUNTER — Ambulatory Visit: Payer: Commercial Managed Care - PPO | Admitting: Family Medicine

## 2023-04-05 VITALS — BP 122/74 | HR 92 | Temp 98.2°F

## 2023-04-05 DIAGNOSIS — L235 Allergic contact dermatitis due to other chemical products: Secondary | ICD-10-CM | POA: Diagnosis not present

## 2023-04-05 DIAGNOSIS — L259 Unspecified contact dermatitis, unspecified cause: Secondary | ICD-10-CM | POA: Insufficient documentation

## 2023-04-05 NOTE — Patient Instructions (Addendum)
Diagnostics: True Test patches placed.   Plan:   Allergic contact dermatitis - Instructions provided on care of the patches for the next 48 hours. - Daionna was instructed to avoid showering for the next 48 hours. - Jilda will follow up in the clinic in 48 hours (Wednesday) and in the clinic in 7 days (Monday) for patch readings. She will take a photo of her back and send it to the clinic on Friday at the 96 hour reading  Call the clinic if this treatment plan is not working well for you  Follow up in 2 days or sooner if needed.

## 2023-04-05 NOTE — Progress Notes (Signed)
Follow-up Note  RE: Victoria Dunn MRN: [id number] DOB: [birth year] Date of Office Visit: 04/05/2023  Primary care provider: Deeann Saint, MD Referring provider: Deeann Saint, MD   Victoria Dunn returns to the office today for the patch test placement, given suspected history of contact dermatitis.    Diagnostics: True Test patches placed.   Plan:   Allergic contact dermatitis - Instructions provided on care of the patches for the next 48 hours. - Victoria Dunn was instructed to avoid showering for the next 48 hours. - Victoria Dunn will follow up in the clinic in 48 hours (Wednesday) and in the clinic in 7 days (Monday) for patch readings. She will take a photo of her back and send it to the clinic on Friday at the 96 hour reading  Call the clinic if this treatment plan is not working well for you  Follow up in 2 days or sooner if needed.

## 2023-04-06 DIAGNOSIS — J3081 Allergic rhinitis due to animal (cat) (dog) hair and dander: Secondary | ICD-10-CM | POA: Diagnosis not present

## 2023-04-06 NOTE — Progress Notes (Unsigned)
   Follow Up Note  RE: Victoria Dunn MRN: [id number] DOB: Jan 17, 2001 Date of Office Visit: 04/07/2023  Referring provider: Deeann Saint, MD Primary care provider: Deeann Saint, MD  History of Present Illness: I had the pleasure of seeing Victoria Dunn for a follow up visit at the [facility] on 04/07/2023. She is a 22 y.o. female, who is being followed for concern for contact dermatitis. Today she is here for initial patch test interpretation, given suspected history of contact dermatitis.   Diagnostics:   TRUE TEST 48 hour reading:   T.R.U.E. Test - 04/07/23 1300       Panel 1   1. Nickel Sulfate 0    2. Wool Alcohols 0    3. Neomycin Sulfate 0    4. Potassium Dichromate 0    5. Caine Mix 0    6. Fragrance Mix 0    7. Colophony 0    8. Paraben Mix 0    9. Negative Control 0    10. Balsam of [place] 0    11. Ethylenediamine Dihydrochloride 0    12. Cobalt Dichloride 0      Panel 2   13. p-tert Butylphenol Formaldehyde Resin 0    14. Epoxy Resin 0    15. Carba Mix 0    16.  Black Rubber Mix 0    17. Cl+ Me-Isothiazolinone 0    18. Quaternium-15 0    19. Methyldibromo Glutaronitrile 0    20. p-Phenylenediamine 0    21. Formaldehyde 0    22. Mercapto Mix 0    23. Thimerosal 0    24. Thiuram Mix 0      Panel 3   25. Diazolidinyl Urea 0    26. Quinoline Mix 0    27. Tixocortol-21-Pivalate 0    28. Gold Sodium Thiosulfate 1   ?   29. Imidazolidinyl Urea 0    30. Budesonide 0    31. Hydrocortisone-17-Butyrate 0    32. Mercaptobenzothiazole 0    33. Bacitracin 0    34. Parthenolide 0    35. Disperse Blue 106 0    36. 2-Bromo-2-Nitropropane-1,3-diol 0              Assessment and Plan: Victoria Dunn is a 22 y.o. female with: Concern for Contact Dermatitis:  Skin care reviewed for patch testing Return next Monday for final read Will take pictures on Friday for review on Monday  Follow-up on Monday  It was my pleasure to see Victoria Dunn today and  participate in her care. Please feel free to contact me with any questions or concerns.  Sincerely,   Tonny Bollman, MD [facility]

## 2023-04-06 NOTE — Progress Notes (Signed)
VIALS EXP 04-05-24 

## 2023-04-07 ENCOUNTER — Ambulatory Visit (INDEPENDENT_AMBULATORY_CARE_PROVIDER_SITE_OTHER): Payer: Commercial Managed Care - PPO | Admitting: Internal Medicine

## 2023-04-07 ENCOUNTER — Encounter: Payer: Self-pay | Admitting: Internal Medicine

## 2023-04-07 DIAGNOSIS — L235 Allergic contact dermatitis due to other chemical products: Secondary | ICD-10-CM

## 2023-04-08 DIAGNOSIS — J301 Allergic rhinitis due to pollen: Secondary | ICD-10-CM | POA: Diagnosis not present

## 2023-04-12 ENCOUNTER — Ambulatory Visit (INDEPENDENT_AMBULATORY_CARE_PROVIDER_SITE_OTHER): Payer: Commercial Managed Care - PPO | Admitting: Family Medicine

## 2023-04-12 ENCOUNTER — Encounter: Payer: Self-pay | Admitting: Family Medicine

## 2023-04-12 DIAGNOSIS — L235 Allergic contact dermatitis due to other chemical products: Secondary | ICD-10-CM | POA: Diagnosis not present

## 2023-04-12 NOTE — Progress Notes (Signed)
    Follow-up Note  RE: Victoria Dunn MRN: [id number] DOB: [birth year] Date of Office Visit: 04/12/2023  Primary care provider: Deeann Saint, MD Referring provider: Deeann Saint, MD   [name] returns to the office today for the final patch test interpretation, given suspected history of contact dermatitis.    Diagnostics:   TRUE TEST  7 day reading:  possible reaction to #1 (Nickel Sulfate)  Summary: Patches placed 04/05/2023 Reading on 04/05/2023 in the clinic possible positive to gold thiosulfate Picture from home on 04/09/2023 appears possible to nickel sulfate and gold thiosulfate Reading on 04/12/2023 in the clinic possibly positive to nickel sulfate  Plan:   Allergic contact dermatitis - The patient has been provided detailed information regarding the substances she is sensitive to, as well as products containing the substances.   - Meticulous avoidance of these substances is recommended.  - If avoidance is not possible, the use of barrier creams or lotions is recommended. - If symptoms persist or progress despite meticulous avoidance of the substances as listed above, a dermatology referral may be warranted. - An email will be sent to the email address you have listed with products that do not contain the substances you were positive to.   Thank you for the opportunity to care for this patient.  Please do not hesitate to contact me with questions.  Thermon Leyland, FNP [facility]

## 2023-04-12 NOTE — Patient Instructions (Addendum)
Diagnostics:   TRUE TEST 7 dayreading: possible reaction to #1 (Nickel Sulfate)  Patches placed 04/05/2023 Reading on 04/05/2023 possible positive to gold thiosulfate Picture on 04/09/2023 appears possible to nickel sulfate and gold thiosulfate Reading on 04/12/2023 possibly positive to nickel sulfate  Plan:   Allergic contact dermatitis - The patient has been provided detailed information regarding the substances she is sensitive to, as well as products containing the substances.   - Meticulous avoidance of these substances is recommended.  - If avoidance is not possible, the use of barrier creams or lotions is recommended. - If symptoms persist or progress despite meticulous avoidance of the substances as listed above, a dermatology referral may be warranted. - An email will be sent to the email address you have listed with products that do not contain the substances you were positive to.   Call the clinic if this treatment plan is not working well for you  Follow up in 3 months or sooner if needed.

## 2023-04-13 NOTE — Addendum Note (Signed)
Addended by: Kellie Simmering, Janelly Switalski on: 04/13/2023 03:55 PM   Modules accepted: Orders

## 2023-04-14 ENCOUNTER — Encounter: Payer: Self-pay | Admitting: Cardiology

## 2023-04-14 ENCOUNTER — Ambulatory Visit: Payer: Commercial Managed Care - PPO | Attending: Cardiology | Admitting: Cardiology

## 2023-04-14 VITALS — BP 122/58 | HR 88 | Ht 63.0 in | Wt 169.6 lb

## 2023-04-14 DIAGNOSIS — R42 Dizziness and giddiness: Secondary | ICD-10-CM | POA: Diagnosis not present

## 2023-04-14 DIAGNOSIS — R002 Palpitations: Secondary | ICD-10-CM

## 2023-04-14 NOTE — Patient Instructions (Signed)
Medication Instructions:  Your physician recommends that you continue on your current medications as directed. Please refer to the Current Medication list given to you today.  *If you need a refill on your cardiac medications before your next appointment, please call your pharmacy*  Follow-Up: At [facility], you and your health needs are our priority.  As part of our continuing mission to provide you with exceptional heart care, we have created designated Provider Care Teams.  These Care Teams include your primary Cardiologist (physician) and Advanced Practice Providers (APPs -  Physician Assistants and Nurse Practitioners) who all work together to provide you with the care you need, when you need it.  We recommend signing up for the patient portal called "MyChart".  Sign up information is provided on this After Visit Summary.  MyChart is used to connect with patients for Virtual Visits (Telemedicine).  Patients are able to view lab/test results, encounter notes, upcoming appointments, etc.  Non-urgent messages can be sent to your provider as well.   To learn more about what you can do with MyChart, go to [email or web address].    Your next appointment:   AS NEEDED with Dr. Schumann     

## 2023-04-14 NOTE — Progress Notes (Signed)
Cardiology Office Note:    Date:  04/14/2023   ID:  Victoria Dunn, DOB 07-05-2001, MRN [id number]  PCP:  Deeann Saint, MD  Cardiologist:  None  Electrophysiologist:  None   Referring MD: Deeann Saint, MD   Chief Complaint  Patient presents with   Palpitations    History of Present Illness:    Victoria Dunn is a 22 y.o. female with no PMHx who presents for follow-up.  She was referred by Dr Salomon Fick for evaluation of tachycardia, initially seen 10/22/2022.  She reports that her heart rate runs 110s to 130s.  She has palpitations at least once per day where she feels like her heart is racing.  Lasts for about 10 minutes or so.  She denies any chest pain or dyspnea but reports has been having lightheadedness with exertion.  States that when riding exercise bike recently felt like she was going to pass out.  Denies any syncopal episodes.  Denies any lower extremity edema.  No smoking history.  Drinks maybe 1 cup of coffee per week.  Family history includes father (who is a patient of mine) had MI in 25s.  ETT 10/29/2022 showed good exercise capacity (10.1 METS), no evidence of ischemia.  Zio patch x 7 days 10/2022 showed no significant arrhythmias, elevated resting heart rate with average 103 bpm.  Echocardiogram 01/01/2023 showed EF 70 to 75%, mild basal septal LVH (normal by my measurement, appears was including RV trabeculation on measurement), normal RV function, no significant valvular disease.  Since last clinic visit, she reports that she is doing well.  Reports no palpitations or lightheadedness.  Denies any chest pain, dyspnea, lower extremity edema.  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    Past Medical History:  Diagnosis Date   Angio-edema    Tachycardia    Urticaria     Past Surgical History:  Procedure Laterality Date   BREAST REDUCTION SURGERY Bilateral 03/2022   FOOT SURGERY Bilateral 10/2018   FOOT SURGERY Left 07/2022     Current Medications: Current Meds  Medication Sig   Azelastine-Fluticasone 137-50 MCG/ACT SUSP Place 1 spray into the nose 2 (two) times daily.   diclofenac (VOLTAREN) 75 MG EC tablet Take 1 tablet (75 mg total) by mouth 2 (two) times daily.   drospirenone-ethinyl estradiol (YAZ) 3-0.02 MG tablet TAKE 1 TABLET EVERY DAY BY MOUTH   drospirenone-ethinyl estradiol (YAZ) 3-0.02 MG tablet Take 1 tablet by mouth daily.   EPINEPHrine 0.3 mg/0.3 mL IJ SOAJ injection Inject 0.3 mg into the muscle for 1 dose   famotidine (PEPCID) 20 MG tablet Take 1 tablet (20 mg total) by mouth 2 (two) times daily.   Olopatadine-Mometasone (RYALTRIS) X543819 MCG/ACT SUSP Place 1-2 sprays into both nostrils 2 (two) times daily for nasal congestion or drainage   Current Facility-Administered Medications for the 04/14/23 encounter (Office Visit) with Little Ishikawa, MD  Medication   betamethasone acetate-betamethasone sodium phosphate (CELESTONE) injection 3 mg     Allergies:   Cherry, Mangifera indica, Soy allergy, Gold-containing drug products, and Nickel   Social History   Socioeconomic History   Marital status: Single    Spouse name: Not on file   Number of children: Not on file   Years of education: Not on file   Highest education level: Not on file  Occupational History   Not on file  Tobacco Use   Smoking status: Never   Smokeless tobacco: Never  Vaping Use   Vaping Use: Not on file  Substance and Sexual Activity   Alcohol use: No   Drug use: No   Sexual activity: Never    Birth control/protection: Pill  Other Topics Concern   Not on file  Social History Narrative   Lives with both parents and younger sister in the same household.   Mother is an Char[employer] fundraiser.   Social Determinants of Health   Financial Resource Strain: Not on file  Food Insecurity: Not on file  Transportation Needs: Not on file  Physical Activity: Not on file  Stress: Not on file  Social Connections: Not on file      Family History: The patient's family history includes Allergies in her mother; Food Allergy in her mother.  ROS:   Please see the history of present illness.     All other systems reviewed and are negative.  EKGs/Labs/Other Studies Reviewed:    The following studies were reviewed today:   EKG:   10/22/2022: Sinus tachycardia, rate 101, no ST abnormality  Recent Labs: 09/09/2022: TSH 2.06 11/14/2022: ALT 17; BUN 7; Creatinine, Ser 0.68; Hemoglobin 13.6; Platelets 312; Potassium 3.9; Sodium 139  Recent Lipid Panel No results found for: "CHOL", "TRIG", "HDL", "CHOLHDL", "VLDL", "LDLCALC", "LDLDIRECT"  Physical Exam:    VS:  BP (!) 122/58 (BP Location: Left Arm, Patient Position: Sitting, Cuff Size: Normal)   Pulse 88   Ht 5\' 3"  (1.6 m)   Wt 169 lb 9.6 oz (76.9 kg)   SpO2 99%   BMI 30.04 kg/m     Wt Readings from Last 3 Encounters:  04/14/23 169 lb 9.6 oz (76.9 kg)  03/24/23 170 lb 4.8 oz (77.2 kg)  10/22/22 159 lb 14.4 oz (72.5 kg) (87 %, Z= 1.14)*   * Growth percentiles are based on CDC (Girls, 2-20 Years) data.     GEN:  Well nourished, well developed in no acute distress HEENT: Normal NECK: No JVD; No carotid bruits CARDIAC: tachycardic, regular, no murmurs, rubs, gallops RESPIRATORY:  Clear to auscultation without rales, wheezing or rhonchi  ABDOMEN: Soft, non-tender, non-distended MUSCULOSKELETAL:  No edema; No deformity  SKIN: Warm and dry NEUROLOGIC:  Alert and oriented x 3 PSYCHIATRIC:  Normal affect   ASSESSMENT:    1. Palpitations   2. Lightheadedness     PLAN:    Palpitations/tachycardia: Reports heart rate runs 110s to 130s.  Also having intermittent palpitations concerning for arrhythmia.  Zio patch x 7 days 10/2022 showed no significant arrhythmias, elevated resting heart rate with average 103 bpm.  Echocardiogram 01/01/2023 showed EF 70 to 75%, mild basal septal LVH (normal by my measurement, appears was including RV trabeculation on  measurement)), normal RV function, no significant valvular disease. -Reports no recent palpitations  Near syncope: Reports feeling lightheaded with exertion, had recent near syncopal episode while riding exercise bike.  Echocardiogram unremarkable as above.  ETT 10/29/2022 showed good exercise capacity (10.1 METS), no evidence of ischemia.  -Reports no recent lightheadedness  RTC as needed    Medication Adjustments/Labs and Tests Ordered: Current medicines are reviewed at length with the patient today.  Concerns regarding medicines are outlined above.  No orders of the defined types were placed in this encounter.  No orders of the defined types were placed in this encounter.   Patient Instructions  Medication Instructions:  Your physician recommends that you continue on your current medications as directed. Please refer to the Current Medication list given to you today.   *If  you need a refill on your cardiac medications before your next appointment, please call your pharmacy*  Follow-Up: At [facility], you and your health needs are our priority.  As part of our continuing mission to provide you with exceptional heart care, we have created designated Provider Care Teams.  These Care Teams include your primary Cardiologist (physician) and Advanced Practice Providers (APPs -  Physician Assistants and Nurse Practitioners) who all work together to provide you with the care you need, when you need it.  We recommend signing up for the patient portal called "MyChart".  Sign up information is provided on this After Visit Summary.  MyChart is used to connect with patients for Virtual Visits (Telemedicine).  Patients are able to view lab/test results, encounter notes, upcoming appointments, etc.  Non-urgent messages can be sent to your provider as well.   To learn more about what you can do with MyChart, go to [email or web address].    Your next appointment:   AS NEEDED with Dr.  Bjorn Pippin   Signed, Little Ishikawa, MD  04/14/2023 9:35 AM     [facility]

## 2023-04-15 NOTE — Addendum Note (Signed)
Addended by: Kellie Simmering, Analeah Brame on: 04/15/2023 04:46 PM   Modules accepted: Orders

## 2023-04-21 ENCOUNTER — Other Ambulatory Visit (HOSPITAL_COMMUNITY): Payer: Self-pay

## 2023-04-21 ENCOUNTER — Other Ambulatory Visit: Payer: Self-pay

## 2023-04-21 MED ORDER — PREDNISONE 20 MG PO TABS
ORAL_TABLET | ORAL | 0 refills | Status: DC
  Filled 2023-04-21: qty 4, 2d supply, fill #0

## 2023-04-21 MED ORDER — MONTELUKAST SODIUM 10 MG PO TABS
ORAL_TABLET | ORAL | 0 refills | Status: DC
  Filled 2023-04-21: qty 2, 2d supply, fill #0

## 2023-04-21 NOTE — Telephone Encounter (Signed)
Patient is interested in starting allergy shots via [facility]. Patient is schedule for Friday 04/23/2023. Left a message for patient to call the office in regards to her [facility] appointment. Will need to speak with her to go over criteria and pre-medication prior to [facility].

## 2023-04-21 NOTE — Telephone Encounter (Signed)
Spoke with patient, went over criteria and pre-medication for her [facility] immunotherapy. Patient verbalized understanding.

## 2023-04-22 ENCOUNTER — Other Ambulatory Visit (HOSPITAL_COMMUNITY): Payer: Self-pay

## 2023-04-23 ENCOUNTER — Ambulatory Visit: Payer: Commercial Managed Care - PPO | Admitting: Allergy

## 2023-04-23 ENCOUNTER — Encounter: Payer: Self-pay | Admitting: Allergy

## 2023-04-23 VITALS — BP 118/84 | HR 110 | Resp 16

## 2023-04-23 DIAGNOSIS — J3089 Other allergic rhinitis: Secondary | ICD-10-CM | POA: Diagnosis not present

## 2023-04-23 DIAGNOSIS — H1013 Acute atopic conjunctivitis, bilateral: Secondary | ICD-10-CM | POA: Diagnosis not present

## 2023-04-23 NOTE — Progress Notes (Signed)
RAPID DESENSITIZATION Note  RE: KESLEIGH SVATEK MRN: [id number] DOB: 2001-02-04 Date of Office Visit: 04/23/2023  Subjective:  Patient presents today for rapid desensitization.  Interval History: Patient has not been ill, she has taken all premedications as per protocol.  Recent/Current History: Pulmonary disease: no Cardiac disease: no Respiratory infection: no Rash: no Itch: no Swelling: no Cough: no Shortness of breath: no Runny/stuffy nose: no Itchy eyes: no Beta-blocker use: no  Patient/guardian was informed of the procedure with verbalized understanding of the risk of anaphylaxis. Consent has been signed.   Medication List:  Current Outpatient Medications  Medication Sig Dispense Refill   Azelastine-Fluticasone 137-50 MCG/ACT SUSP Place 1 spray into the nose 2 (two) times daily. 23 g 0   diclofenac (VOLTAREN) 75 MG EC tablet Take 1 tablet (75 mg total) by mouth 2 (two) times daily. 60 tablet 1   drospirenone-ethinyl estradiol (YAZ) 3-0.02 MG tablet Take 1 tablet by mouth daily. 84 tablet 4   EPINEPHrine 0.3 mg/0.3 mL IJ SOAJ injection Inject 0.3 mg into the muscle for 1 dose 2 each 1   famotidine (PEPCID) 20 MG tablet Take 1 tablet (20 mg total) by mouth 2 (two) times daily. 60 tablet 3   montelukast (SINGULAIR) 10 MG tablet Take 1 tablet the morning before and the morning of [facility]. 2 tablet 0   Olopatadine-Mometasone (RYALTRIS) 665-25 MCG/ACT SUSP Place 1-2 sprays into both nostrils 2 (two) times daily for nasal congestion or drainage 29 g 5   ondansetron (ZOFRAN) 4 MG tablet Take 1 tablet (4 mg total) by mouth every 8 (eight) hours as needed for nausea or vomiting. 20 tablet 0   predniSONE (DELTASONE) 20 MG tablet Take 40 mg (2 tablets) the morning before and 40 mg (2 tablet) the morning of [facility]. 4 tablet 0   drospirenone-ethinyl estradiol (YAZ) 3-0.02 MG tablet TAKE 1 TABLET EVERY DAY BY MOUTH 84 tablet 4   Current Facility-Administered Medications  Medication Dose  Route Frequency Provider Last Rate Last Admin   betamethasone acetate-betamethasone sodium phosphate (CELESTONE) injection 3 mg  3 mg Intra-articular Once [name], DPM       Allergies: Allergies  Allergen Reactions   Cherry Swelling    And hives   Mangifera Indica Hives   Soy Allergy Swelling    Soy, mangoes, cherries-tongue and lip edema.  Has epi pen.   Gold-Containing Drug Products Rash   Nickel Rash   I reviewed her past medical history, social history, family history, and environmental history and no significant changes have been reported from her previous visit.  ROS: Negative except as per HPI.  Objective: BP 118/84   Pulse (!) 110   Resp 16   SpO2 98%  There is no height or weight on file to calculate BMI.   General Appearance:  Alert, cooperative, no distress, appears stated age  Head:  Normocephalic, without obvious abnormality, atraumatic  Eyes:  Conjunctiva clear, EOM's intact  Nose: Nares normal  Throat: Lips, tongue normal; teeth and gums normal, normal posterior oropharnyx  Neck: Supple, symmetrical  Lungs:   CTAB, Respirations unlabored, no coughing  Heart:  Appears well perfused  Extremities: No edema  Skin: Skin color, texture, turgor normal, no rashes or lesions on visualized portions of skin  Neurologic: No gross deficits     Diagnostics:  PROCEDURES:  Patient received the following doses every hour: Step 1:  0.50ml - 1:1,000,000 dilution (silver vial) Step 2:  0.11ml - 1:1,000,000 dilution (silver vial) Step  3: 0.58ml - 1:100,000 dilution (blue vial)  Step 4: 0.34ml - 1:100,000 dilution (blue vial)  Step 5: 0.9ml - 1:10,000 dilution (gold vial) Step 6: 0.33ml - 1:10,000 dilution (gold vial) Step 7: 0.3ml - 1:10,000 dilution (gold vial) Step 8: 0.46ml - 1:10,000 dilution (gold vial)  Patient was observed for 1 hour after the last dose.   Procedure started at 830am Procedure ended at 320pm   ASSESSMENT/PLAN:   Patient has tolerated the  rapid desensitization protocol.  Next appointment: Start at 0.62ml of 1:1000 dilution (green vial) and build up per protocol.   Margo Aye, MD [facility]

## 2023-04-30 ENCOUNTER — Other Ambulatory Visit (HOSPITAL_COMMUNITY): Payer: Self-pay

## 2023-04-30 ENCOUNTER — Ambulatory Visit (INDEPENDENT_AMBULATORY_CARE_PROVIDER_SITE_OTHER): Payer: Commercial Managed Care - PPO | Admitting: *Deleted

## 2023-04-30 ENCOUNTER — Encounter: Payer: Self-pay | Admitting: Pharmacist

## 2023-04-30 ENCOUNTER — Other Ambulatory Visit: Payer: Self-pay

## 2023-04-30 DIAGNOSIS — J309 Allergic rhinitis, unspecified: Secondary | ICD-10-CM | POA: Diagnosis not present

## 2023-05-03 ENCOUNTER — Other Ambulatory Visit (HOSPITAL_COMMUNITY): Payer: Self-pay

## 2023-05-03 ENCOUNTER — Encounter: Payer: Self-pay | Admitting: Podiatry

## 2023-05-03 ENCOUNTER — Ambulatory Visit: Payer: Commercial Managed Care - PPO | Admitting: Podiatry

## 2023-05-03 DIAGNOSIS — M67472 Ganglion, left ankle and foot: Secondary | ICD-10-CM | POA: Diagnosis not present

## 2023-05-03 MED ORDER — METHYLPREDNISOLONE 4 MG PO TBPK
ORAL_TABLET | ORAL | 0 refills | Status: DC
  Filled 2023-05-03: qty 21, 6d supply, fill #0

## 2023-05-03 MED ORDER — BETAMETHASONE SOD PHOS & ACET 6 (3-3) MG/ML IJ SUSP
3.0000 mg | Freq: Once | INTRAMUSCULAR | Status: AC
  Administered 2023-05-03: 3 mg via INTRA_ARTICULAR

## 2023-05-03 NOTE — Progress Notes (Signed)
   Chief Complaint  Patient presents with   Foot Pain    Follow up ganglion cyst dorsal midfoot left   "I feel like this cyst has come back with a couple of small ones too. Hurts when I stand a lot"    HPI: 22 y.o. female presenting today for what appears to be recurrence of the ganglion cystic lesion to the dorsum of the left foot.  History of surgical excision of a ganglion cyst to this exact same area 08/27/2022.  Patient states that she has now also developed ganglion cyst along the EHL tendon of the same foot.  Presenting for other treatment and evaluation  Past Medical History:  Diagnosis Date   Angio-edema    Tachycardia    Urticaria     Past Surgical History:  Procedure Laterality Date   BREAST REDUCTION SURGERY Bilateral 03/2022   FOOT SURGERY Bilateral 10/2018   FOOT SURGERY Left 07/2022    Allergies  Allergen Reactions   Cherry Swelling    And hives   Mangifera Indica Hives   Soy Allergy Swelling    Soy, mangoes, cherries-tongue and lip edema.  Has epi pen.   Gold-Containing Drug Products Rash   Nickel Rash     Physical Exam: General: The patient is alert and oriented x3 in no acute distress.  Dermatology: Skin is warm, dry and supple bilateral lower extremities. Negative for open lesions or macerations.  Vascular: Palpable pedal pulses bilaterally. Capillary refill within normal limits.  Negative for any significant edema or erythema  Neurological: Light touch and protective threshold grossly intact  Musculoskeletal Exam: No pedal deformities noted.  There continues to be a nonadherent fluctuant lesion to the dorsum of the left foot.  Mostly unchanged since last visit.  There is now a nonadherent small mass overlying the EHL tendon of the same foot consistent with ganglion cyst formation.  Assessment: 1.  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  Aspiration of the lesions unlikely and have been unsuccessful in the past due to the deeper nature of the cyst and likely surrounding scar tissue around the area. -Prescription for Medrol Dosepak - Continue compression daily.  Compression socks dispensed - Return to clinic 1 month  *Working at an [employer] in [place], [place] for the summer      [name], DPM [facility]  Dr. [name], DPM    [address] [place], [place] [address]                Office [phone]  Fax [phone]

## 2023-05-04 ENCOUNTER — Other Ambulatory Visit (HOSPITAL_COMMUNITY): Payer: Self-pay

## 2023-05-05 ENCOUNTER — Other Ambulatory Visit: Payer: Self-pay

## 2023-05-07 ENCOUNTER — Other Ambulatory Visit (HOSPITAL_COMMUNITY): Payer: Self-pay

## 2023-05-07 ENCOUNTER — Ambulatory Visit (INDEPENDENT_AMBULATORY_CARE_PROVIDER_SITE_OTHER): Payer: Commercial Managed Care - PPO

## 2023-05-07 DIAGNOSIS — J309 Allergic rhinitis, unspecified: Secondary | ICD-10-CM

## 2023-05-12 ENCOUNTER — Encounter: Payer: Self-pay | Admitting: Family Medicine

## 2023-05-14 ENCOUNTER — Other Ambulatory Visit (HOSPITAL_COMMUNITY): Payer: Self-pay

## 2023-05-18 ENCOUNTER — Ambulatory Visit (INDEPENDENT_AMBULATORY_CARE_PROVIDER_SITE_OTHER): Payer: Commercial Managed Care - PPO

## 2023-05-18 DIAGNOSIS — J309 Allergic rhinitis, unspecified: Secondary | ICD-10-CM | POA: Diagnosis not present

## 2023-05-20 ENCOUNTER — Other Ambulatory Visit (HOSPITAL_COMMUNITY): Payer: Self-pay

## 2023-05-24 ENCOUNTER — Telehealth: Payer: Self-pay

## 2023-05-24 ENCOUNTER — Other Ambulatory Visit (HOSPITAL_COMMUNITY): Payer: Self-pay

## 2023-05-24 NOTE — Telephone Encounter (Signed)
LVM for patient to call back [phone], or to call PCP office to schedule follow up apt. AS, CMA  

## 2023-05-31 ENCOUNTER — Other Ambulatory Visit (HOSPITAL_COMMUNITY): Payer: Self-pay

## 2023-05-31 MED ORDER — DROSPIRENONE-ETHINYL ESTRADIOL 3-0.02 MG PO TABS
1.0000 | ORAL_TABLET | Freq: Every day | ORAL | 0 refills | Status: DC
  Filled 2023-05-31 – 2023-06-01 (×3): qty 28, 28d supply, fill #0

## 2023-06-01 ENCOUNTER — Other Ambulatory Visit (HOSPITAL_COMMUNITY): Payer: Self-pay

## 2023-06-01 ENCOUNTER — Ambulatory Visit: Payer: Self-pay | Admitting: *Deleted

## 2023-06-01 DIAGNOSIS — J309 Allergic rhinitis, unspecified: Secondary | ICD-10-CM

## 2023-06-02 ENCOUNTER — Ambulatory Visit: Payer: Commercial Managed Care - PPO | Admitting: Podiatry

## 2023-06-02 DIAGNOSIS — M67472 Ganglion, left ankle and foot: Secondary | ICD-10-CM | POA: Diagnosis not present

## 2023-06-02 NOTE — Progress Notes (Signed)
   No chief complaint on file.   HPI: 22 y.o. female presenting today for what appears to be recurrence of the ganglion cystic lesion to the dorsum of the left foot.  History of surgical excision of a ganglion cyst to this exact same area 08/27/2022.  Patient is doing better however she continues to have some slight sensitivity associated to the dorsum of the foot and she wears a compression sock daily  Past Medical History:  Diagnosis Date   Angio-edema    Tachycardia    Urticaria     Past Surgical History:  Procedure Laterality Date   BREAST REDUCTION SURGERY Bilateral 03/2022   FOOT SURGERY Bilateral 10/2018   FOOT SURGERY Left 07/2022    Allergies  Allergen Reactions   Cherry Swelling    And hives   Mangifera Indica Hives   Soy Allergy Swelling    Soy, mangoes, cherries-tongue and lip edema.  Has epi pen.   Gold-Containing Drug Products Rash   Nickel Rash     Physical Exam: General: The patient is alert and oriented x3 in no acute distress.  Dermatology: Skin is warm, dry and supple bilateral lower extremities. Negative for open lesions or macerations.  Vascular: Palpable pedal pulses bilaterally. Capillary refill within normal limits.  Negative for any significant edema or erythema  Neurological: Light touch and protective threshold grossly intact  Musculoskeletal Exam: Unchanged.  No pedal deformities noted.  There continues to be a nonadherent fluctuant lesion to the dorsum of the left foot.  Mostly unchanged since last visit.  There is now a nonadherent small mass overlying the EHL tendon of the same foot consistent with ganglion cyst formation.  Assessment: 1.  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 [name], DPM    [address] [place], [place] [address]                Office [phone]  Fax [phone]

## 2023-06-11 ENCOUNTER — Ambulatory Visit (INDEPENDENT_AMBULATORY_CARE_PROVIDER_SITE_OTHER): Payer: Commercial Managed Care - PPO | Admitting: *Deleted

## 2023-06-11 ENCOUNTER — Encounter: Payer: Self-pay | Admitting: Podiatry

## 2023-06-11 DIAGNOSIS — J309 Allergic rhinitis, unspecified: Secondary | ICD-10-CM | POA: Diagnosis not present

## 2023-06-12 ENCOUNTER — Other Ambulatory Visit: Payer: Commercial Managed Care - PPO

## 2023-06-13 ENCOUNTER — Ambulatory Visit
Admission: RE | Admit: 2023-06-13 | Discharge: 2023-06-13 | Disposition: A | Discharge status: Home / Self Care | Payer: Commercial Managed Care - PPO | Source: Ambulatory Visit | Attending: Podiatry | Admitting: Podiatry

## 2023-06-13 DIAGNOSIS — M67472 Ganglion, left ankle and foot: Secondary | ICD-10-CM

## 2023-06-13 DIAGNOSIS — M85672 Other cyst of bone, left ankle and foot: Secondary | ICD-10-CM | POA: Diagnosis not present

## 2023-06-15 ENCOUNTER — Ambulatory Visit (INDEPENDENT_AMBULATORY_CARE_PROVIDER_SITE_OTHER): Payer: Commercial Managed Care - PPO | Admitting: *Deleted

## 2023-06-15 DIAGNOSIS — J309 Allergic rhinitis, unspecified: Secondary | ICD-10-CM

## 2023-06-21 ENCOUNTER — Other Ambulatory Visit (HOSPITAL_COMMUNITY): Payer: Self-pay

## 2023-06-21 ENCOUNTER — Ambulatory Visit (INDEPENDENT_AMBULATORY_CARE_PROVIDER_SITE_OTHER): Payer: Commercial Managed Care - PPO

## 2023-06-21 DIAGNOSIS — Z01419 Encounter for gynecological examination (general) (routine) without abnormal findings: Secondary | ICD-10-CM | POA: Diagnosis not present

## 2023-06-21 DIAGNOSIS — J309 Allergic rhinitis, unspecified: Secondary | ICD-10-CM

## 2023-06-21 DIAGNOSIS — Z6826 Body mass index (BMI) 26.0-26.9, adult: Secondary | ICD-10-CM | POA: Diagnosis not present

## 2023-06-21 DIAGNOSIS — N946 Dysmenorrhea, unspecified: Secondary | ICD-10-CM | POA: Diagnosis not present

## 2023-06-21 MED ORDER — NORETHIN ACE-ETH ESTRAD-FE 1-20 MG-MCG PO TABS
1.0000 | ORAL_TABLET | Freq: Every day | ORAL | 4 refills | Status: DC
  Filled 2023-06-21: qty 84, 84d supply, fill #0
  Filled 2023-08-13 – 2023-08-30 (×2): qty 84, 84d supply, fill #1
  Filled 2023-11-30: qty 84, 84d supply, fill #2
  Filled 2024-01-17 – 2024-01-27 (×3): qty 84, 84d supply, fill #3
  Filled 2024-01-27: qty 28, 28d supply, fill #3
  Filled 2024-02-03 – 2024-02-09 (×2): qty 84, 84d supply, fill #3
  Filled 2024-04-06 – 2024-04-27 (×5): qty 84, 84d supply, fill #4
  Filled ????-??-??: fill #3

## 2023-06-23 ENCOUNTER — Encounter: Payer: Self-pay | Admitting: Podiatry

## 2023-06-28 ENCOUNTER — Telehealth: Payer: Self-pay | Admitting: Podiatry

## 2023-06-28 ENCOUNTER — Ambulatory Visit: Payer: Commercial Managed Care - PPO | Admitting: Family Medicine

## 2023-06-28 NOTE — Telephone Encounter (Signed)
Spoke with patient via telephone regarding MRI results.  For now recommend either conservative treatment and simple observation of the small ganglion cyst versus repeat surgical excision of the ganglion cyst.  Patient states that currently the cyst is tolerable.  She is concerned because she is going to school this fall and with increased walking she is concerned that the cyst will become more symptomatic. Recommend shoes that do not irritate the area.  If she would like to proceed with revisional resection of the ganglion cyst she will contact our office and we can arrange it when she is back in town during fall break or winter holidays.  [name], DPM [facility]  Dr. [name], DPM    [address] [place], [place] [address]                Office [phone]  Fax [phone]

## 2023-07-02 ENCOUNTER — Ambulatory Visit (INDEPENDENT_AMBULATORY_CARE_PROVIDER_SITE_OTHER): Payer: Commercial Managed Care - PPO

## 2023-07-02 ENCOUNTER — Encounter: Payer: Self-pay | Admitting: Family Medicine

## 2023-07-02 DIAGNOSIS — J309 Allergic rhinitis, unspecified: Secondary | ICD-10-CM

## 2023-07-05 ENCOUNTER — Ambulatory Visit: Payer: Commercial Managed Care - PPO | Admitting: Family Medicine

## 2023-07-05 ENCOUNTER — Encounter: Payer: Self-pay | Admitting: Family Medicine

## 2023-07-05 ENCOUNTER — Other Ambulatory Visit: Payer: Self-pay

## 2023-07-05 VITALS — BP 100/60 | HR 105 | Temp 98.2°F | Resp 20 | Wt 178.3 lb

## 2023-07-05 DIAGNOSIS — L501 Idiopathic urticaria: Secondary | ICD-10-CM

## 2023-07-05 DIAGNOSIS — T781XXD Other adverse food reactions, not elsewhere classified, subsequent encounter: Secondary | ICD-10-CM

## 2023-07-05 DIAGNOSIS — J3089 Other allergic rhinitis: Secondary | ICD-10-CM | POA: Diagnosis not present

## 2023-07-05 DIAGNOSIS — J309 Allergic rhinitis, unspecified: Secondary | ICD-10-CM | POA: Insufficient documentation

## 2023-07-05 DIAGNOSIS — H1013 Acute atopic conjunctivitis, bilateral: Secondary | ICD-10-CM

## 2023-07-05 DIAGNOSIS — J302 Other seasonal allergic rhinitis: Secondary | ICD-10-CM | POA: Diagnosis not present

## 2023-07-05 DIAGNOSIS — T7800XA Anaphylactic reaction due to unspecified food, initial encounter: Secondary | ICD-10-CM | POA: Diagnosis not present

## 2023-07-05 MED ORDER — RYALTRIS 665-25 MCG/ACT NA SUSP: 1.0000 | Freq: Two times a day (BID) | NASAL | 5 refills | Status: DC

## 2023-07-05 NOTE — Patient Instructions (Addendum)
Allergic rhinitis Moderately well-controlled Continue allergen avoidance measures directed toward dust mite, cockroach, pollen, and pets as listed below Continue allergen immunotherapy and have access to an epinephrine autoinjector set per protocol Continue cetirizine 10 mg once a day as needed for runny nose or itch Continue Ryaltris 2 sprays in each nostril once or twice a day as needed for nasal symptoms Consider saline nasal rinses as needed for nasal symptoms. Use this before any medicated nasal sprays for best result  Allergic conjunctivitis Moderately well-controlled Some over the counter eye drops include Pataday one drop in each eye once a day as needed for red, itchy eyes OR Zaditor one drop in each eye twice a day as needed for red itchy eyes. Avoid eye drops that say red eye relief as they may contain medications that dry out your eyes.   Hives (urticaria) Stable Take the least amount of medications while remaining hive free Cetirizine (Zyrtec) 10mg  twice a day and famotidine (Pepcid) 20 mg twice a day. If no symptoms for 7-14 days then decrease to. Cetirizine (Zyrtec) 10mg  twice a day and famotidine (Pepcid) 20 mg once a day.  If no symptoms for 7-14 days then decrease to. Cetirizine (Zyrtec) 10mg  twice a day.  If no symptoms for 7-14 days then decrease to. Cetirizine (Zyrtec) 10mg  once a day.  May use Benadryl (diphenhydramine) as needed for breakthrough hives       If symptoms return, then step up dosage Keep a detailed symptom journal including foods eaten, contact with allergens, medications taken, weather changes.   Food allergy Stable Continue to avoid soy, peach, cherry, and mango.  In case of an allergic reaction, give Benadryl 50 mg every 4 hours, and if life-threatening symptoms occur, inject with EpiPen 0.3 mg.  Oral allergy syndrome Continue to avoid fresh apple and fruits that bother your mouth.  Okay to continue with applesauce and apple juice as these are not  bothersome  Call the clinic if this treatment plan is not working well for you  Follow up in 6 months or sooner if needed.  The oral allergy syndrome (OAS) or pollen-food allergy syndrome (PFAS) is a relatively common form of food allergy, particularly in adults. It typically occurs in people who have pollen allergies when the immune system "sees" proteins on the food that look like proteins on the pollen. This results in the allergy antibody (IgE) binding to the food instead of the pollen. Patients typically report itching and/or mild swelling of the mouth and throat immediately following ingestion of certain uncooked fruits (including nuts) or raw vegetables. Only a very small number of affected individuals experience systemic allergic reactions, such as anaphylaxis which occurs with true food allergies.      Reducing Pollen Exposure The American Academy of Allergy, Asthma and Immunology suggests the following steps to reduce your exposure to pollen during allergy seasons. Do not hang sheets or clothing out to dry; pollen may collect on these items. Do not mow lawns or spend time around freshly cut grass; mowing stirs up pollen. Keep windows closed at night.  Keep car windows closed while driving. Minimize morning activities outdoors, a time when pollen counts are usually at their highest. Stay indoors as much as possible when pollen counts or humidity is high and on windy days when pollen tends to remain in the air longer. Use air conditioning when possible.  Many air conditioners have filters that trap the pollen spores. Use a HEPA room air filter to remove pollen form  the indoor air you breathe.   Control of Dust Mite Allergen Dust mites play a major role in allergic asthma and rhinitis. They occur in environments with high humidity wherever human skin is found. Dust mites absorb humidity from the atmosphere (ie, they do not drink) and feed on organic matter (including shed human and animal  skin). Dust mites are a microscopic type of insect that you cannot see with the naked eye. High levels of dust mites have been detected from mattresses, pillows, carpets, upholstered furniture, bed covers, clothes, soft toys and any woven material. The principal allergen of the dust mite is found in its feces. A gram of dust may contain 1,000 mites and 250,000 fecal particles. Mite antigen is easily measured in the air during house cleaning activities. Dust mites do not bite and do not cause harm to humans, other than by triggering allergies/asthma.  Ways to decrease your exposure to dust mites in your home:  1. Encase mattresses, box springs and pillows with a mite-impermeable barrier or cover  2. Wash sheets, blankets and drapes weekly in hot water (130 F) with detergent and dry them in a dryer on the hot setting.  3. Have the room cleaned frequently with a vacuum cleaner and a damp dust-mop. For carpeting or rugs, vacuuming with a vacuum cleaner equipped with a high-efficiency particulate air (HEPA) filter. The dust mite allergic individual should not be in a room which is being cleaned and should wait 1 hour after cleaning before going into the room.  4. Do not sleep on upholstered furniture (eg, couches).  5. If possible removing carpeting, upholstered furniture and drapery from the home is ideal. Horizontal blinds should be eliminated in the rooms where the person spends the most time (bedroom, study, television room). Washable vinyl, roller-type shades are optimal.  6. Remove all non-washable stuffed toys from the bedroom. Wash stuffed toys weekly like sheets and blankets above.  7. Reduce indoor humidity to less than 50%. Inexpensive humidity monitors can be purchased at most hardware stores. Do not use a humidifier as can make the problem worse and are not recommended.  Control of Dog or Cat Allergen Avoidance is the best way to manage a dog or cat allergy. If you have a dog or cat and  are allergic to dog or cats, consider removing the dog or cat from the home. If you have a dog or cat but don't want to find it a new home, or if your family wants a pet even though someone in the household is allergic, here are some strategies that may help keep symptoms at bay:  Keep the pet out of your bedroom and restrict it to only a few rooms. Be advised that keeping the dog or cat in only one room will not limit the allergens to that room. Don't pet, hug or kiss the dog or cat; if you do, wash your hands with soap and water. High-efficiency particulate air (HEPA) cleaners run continuously in a bedroom or living room can reduce allergen levels over time. Regular use of a high-efficiency vacuum cleaner or a central vacuum can reduce allergen levels. Giving your dog or cat a bath at least once a week can reduce airborne allergen.  Control of Cockroach Allergen Cockroach allergen has been identified as an important cause of acute attacks of asthma, especially in urban settings.  There are fifty-five species of cockroach that exist in the [place], however only three, the [place], [place] and [place] species produce  allergen that can affect patients with Asthma.  Allergens can be obtained from fecal particles, egg casings and secretions from cockroaches.    Remove food sources. Reduce access to water. Seal access and entry points. Spray runways with 0.5-1% Diazinon or Chlorpyrifos Blow boric acid power under stoves and refrigerator. Place bait stations (hydramethylnon) at feeding sites.

## 2023-07-05 NOTE — Addendum Note (Signed)
Addended by: Hetty Blend on: 07/05/2023 06:40 PM   Modules accepted: Orders

## 2023-07-05 NOTE — Progress Notes (Signed)
[address] [place] [address] Dept: [phone]  FOLLOW UP NOTE  Patient ID: Victoria Dunn, female    DOB: [birth year]  Age: 22 y.o. MRN: [id number] Date of Office Visit: 07/05/2023  Assessment  Chief Complaint: Follow-up  HPI Victoria Dunn is a 22 year old female who presents to clinic for follow-up visit.  She was last seen in this clinic on 04/23/2019 for 4 allergen immunotherapy rash protocol.  Prior to that visit she was seen in this clinic on 03/24/2023 by Dr. 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  She is accompanied by her mother and father who assist with history.  At today's visit, she reports her allergic rhinitis has been moderately well-controlled with symptoms including nasal congestion and clear rhinorrhea.  She continues cetirizine 3 to 4 days a week and had used Ryaltris with relief of symptoms, however, she is currently out of this medication.  She is not currently using a nasal saline rinse.  She continues allergen immunotherapy with no larger local reactions.  She completed Rush immunotherapy on 04/23/2023 and continues weekly immunotherapy at this time.  EpiPens are up-to-date.  Allergic conjunctivitis is reported as moderately well-controlled with occasional red and itchy eyes for which he infrequently uses allergy eyedrops with relief of symptoms.  She denies any incidences of hives since her last visit to this clinic.  She continues to avoid soy, peach, cherry, mango, and red apples.  She reports that she is able to tolerate green apples, applesauce, and apple juice.  Her last food allergy testing via lab was on 12/20/2019 and was positive to bing cherries IgE at 0.77 and mango IgE at 0.21.   Her current medications are listed in the chart.  Drug Allergies:  Allergies  Allergen Reactions   Cherry Swelling    And hives   Mangifera Indica Hives   Soy Allergy Swelling     Soy, mangoes, cherries-tongue and lip edema.  Has epi pen.   Gold-Containing Drug Products Rash   Nickel Rash    Physical Exam: BP 100/60   Pulse (!) 105   Temp 98.2 F (36.8 C) (Temporal)   Resp 20   Wt 178 lb 4.8 oz (80.9 kg)   SpO2 99%   BMI 31.58 kg/m    Physical Exam Vitals reviewed.  Constitutional:      Appearance: Normal appearance.  HENT:     Head: Normocephalic and atraumatic.     Right Ear: Tympanic membrane normal.     Left Ear: Tympanic membrane normal.     Nose:     Comments: Bilateral nares edematous and pale with thin clear nasal drainage noted.  Pharynx slightly erythematous with no exudate.  Ears normal.  Eyes normal.    Mouth/Throat:     Pharynx: Oropharynx is clear.  Eyes:     Conjunctiva/sclera: Conjunctivae normal.  Cardiovascular:     Rate and Rhythm: Normal rate and regular rhythm.     Heart sounds: Normal heart sounds. No murmur heard. Pulmonary:     Effort: Pulmonary effort is normal.     Breath sounds: Normal breath sounds.     Comments: Lungs clear to auscultation Musculoskeletal:        General: Normal range of motion.     Cervical back: Normal range of motion and neck supple.  Skin:    General: Skin is warm and dry.  Neurological:     Mental Status: She  is alert and oriented to person, place, and time.  Psychiatric:        Mood and Affect: Mood normal.        Behavior: Behavior normal.        Thought Content: Thought content normal.        Judgment: Judgment normal.      Assessment and Plan: 1. Seasonal and perennial allergic rhinitis   2. Idiopathic urticaria   3. Allergic conjunctivitis of both eyes   4. Allergy with anaphylaxis due to food   5. Pollen-food allergy, subsequent encounter     Meds ordered this encounter  Medications   Olopatadine-Mometasone (RYALTRIS) 665-25 MCG/ACT SUSP    Sig: Place 1-2 sprays into both nostrils 2 (two) times daily for nasal congestion or drainage    Dispense:  29 g    Refill:  5     Contact information: [phone]    Patient Instructions  Allergic rhinitis Moderately well-controlled Continue allergen avoidance measures directed toward dust mite, cockroach, pollen, and pets as listed below Continue allergen immunotherapy and have access to an epinephrine autoinjector set per protocol Continue cetirizine 10 mg once a day as needed for runny nose or itch Continue Ryaltris 2 sprays in each nostril once or twice a day as needed for nasal symptoms Consider saline nasal rinses as needed for nasal symptoms. Use this before any medicated nasal sprays for best result  Allergic conjunctivitis Moderately well-controlled Some over the counter eye drops include Pataday one drop in each eye once a day as needed for red, itchy eyes OR Zaditor one drop in each eye twice a day as needed for red itchy eyes. Avoid eye drops that say red eye relief as they may contain medications that dry out your eyes.   Hives (urticaria) Stable Take the least amount of medications while remaining hive free Cetirizine (Zyrtec) 10mg  twice a day and famotidine (Pepcid) 20 mg twice a day. If no symptoms for 7-14 days then decrease to. Cetirizine (Zyrtec) 10mg  twice a day and famotidine (Pepcid) 20 mg once a day.  If no symptoms for 7-14 days then decrease to. Cetirizine (Zyrtec) 10mg  twice a day.  If no symptoms for 7-14 days then decrease to. Cetirizine (Zyrtec) 10mg  once a day.  May use Benadryl (diphenhydramine) as needed for breakthrough hives       If symptoms return, then step up dosage Keep a detailed symptom journal including foods eaten, contact with allergens, medications taken, weather changes.   Food allergy Stable Continue to avoid soy, peach, cherry, and mango.  In case of an allergic reaction, give Benadryl 50 mg every 4 hours, and if life-threatening symptoms occur, inject with EpiPen 0.3 mg.  Oral allergy syndrome Continue to avoid fresh apple and fruits that bother your mouth.   Okay to continue with applesauce and apple juice as these are not bothersome  Call the clinic if this treatment plan is not working well for you  Follow up in 6 months or sooner if needed.   Return in about 6 months (around 01/05/2024), or if symptoms worsen or fail to improve.    Thank you for the opportunity to care for this patient.  Please do not hesitate to contact me with questions.  Thermon Leyland, FNP [facility]

## 2023-07-06 NOTE — Telephone Encounter (Signed)
Patient was seen in clinic yesterday 07/05/2023 and signed the Immunotherapy Acknowledgment form to transfer her vials to [facility]. Form has been faxed to [phone].

## 2023-07-07 ENCOUNTER — Ambulatory Visit (INDEPENDENT_AMBULATORY_CARE_PROVIDER_SITE_OTHER): Payer: Commercial Managed Care - PPO | Admitting: *Deleted

## 2023-07-07 DIAGNOSIS — J309 Allergic rhinitis, unspecified: Secondary | ICD-10-CM

## 2023-07-07 NOTE — Progress Notes (Signed)
Immunotherapy   Patient Details  Name: Victoria Dunn MRN: [id number] Date of Birth: 27-Feb-2001  07/07/2023  Victoria Dunn here to pick up  MITE-CR, POLLEN-PET Following schedule: A  Frequency:1 time per week, Red #1 Epi-Pen:Epi-Pen Available  Consent signed and patient instructions given. Patient picked up her Allergy Vials and Injection records to continue at [facility]. I have received the sign forms of the [facility], they have been labeled and placed in bulk scanning. Per [facility] they do not allow allergy vials to be mailed to them, patient states that when new vials are needed to contact her and arrange the appointment and her parents will pick up her vials on her behalf.     Fernandez-Vernon 07/07/2023, 2:46 PM

## 2023-07-12 DIAGNOSIS — Z516 Encounter for desensitization to allergens: Secondary | ICD-10-CM | POA: Diagnosis not present

## 2023-07-19 DIAGNOSIS — Z516 Encounter for desensitization to allergens: Secondary | ICD-10-CM | POA: Diagnosis not present

## 2023-07-28 DIAGNOSIS — Z516 Encounter for desensitization to allergens: Secondary | ICD-10-CM | POA: Diagnosis not present

## 2023-08-04 DIAGNOSIS — Z516 Encounter for desensitization to allergens: Secondary | ICD-10-CM | POA: Diagnosis not present

## 2023-08-11 DIAGNOSIS — Z516 Encounter for desensitization to allergens: Secondary | ICD-10-CM | POA: Diagnosis not present

## 2023-08-13 ENCOUNTER — Other Ambulatory Visit (HOSPITAL_COMMUNITY): Payer: Self-pay

## 2023-08-16 ENCOUNTER — Other Ambulatory Visit (HOSPITAL_COMMUNITY): Payer: Self-pay

## 2023-08-18 DIAGNOSIS — Z516 Encounter for desensitization to allergens: Secondary | ICD-10-CM | POA: Diagnosis not present

## 2023-08-19 ENCOUNTER — Encounter: Payer: Self-pay | Admitting: Podiatry

## 2023-08-20 ENCOUNTER — Other Ambulatory Visit (HOSPITAL_COMMUNITY): Payer: Self-pay

## 2023-08-25 DIAGNOSIS — Z516 Encounter for desensitization to allergens: Secondary | ICD-10-CM | POA: Diagnosis not present

## 2023-08-28 ENCOUNTER — Ambulatory Visit: Payer: Commercial Managed Care - PPO

## 2023-08-30 ENCOUNTER — Other Ambulatory Visit: Payer: Self-pay

## 2023-09-01 DIAGNOSIS — Z516 Encounter for desensitization to allergens: Secondary | ICD-10-CM | POA: Diagnosis not present

## 2023-09-08 DIAGNOSIS — Z516 Encounter for desensitization to allergens: Secondary | ICD-10-CM | POA: Diagnosis not present

## 2023-09-13 ENCOUNTER — Telehealth: Payer: Self-pay | Admitting: *Deleted

## 2023-09-13 NOTE — Telephone Encounter (Signed)
Received injection records and fax from [employer] stating that patient will call to order new serum and to not mail the allergy serum, patient will need to arrange pickup. I called and left a voicemail advising patient that I received the fax and to please call back to set up arrangements for pickup. Vials have not been ordered yet. Fax and injection records have been labeled and placed in bulk scanning.

## 2023-09-15 DIAGNOSIS — Z516 Encounter for desensitization to allergens: Secondary | ICD-10-CM | POA: Diagnosis not present

## 2023-09-16 DIAGNOSIS — J3081 Allergic rhinitis due to animal (cat) (dog) hair and dander: Secondary | ICD-10-CM | POA: Diagnosis not present

## 2023-09-16 NOTE — Progress Notes (Signed)
VIALS EXP 09-15-24

## 2023-09-22 DIAGNOSIS — Z516 Encounter for desensitization to allergens: Secondary | ICD-10-CM | POA: Diagnosis not present

## 2023-09-29 DIAGNOSIS — Z516 Encounter for desensitization to allergens: Secondary | ICD-10-CM | POA: Diagnosis not present

## 2023-10-04 ENCOUNTER — Ambulatory Visit (INDEPENDENT_AMBULATORY_CARE_PROVIDER_SITE_OTHER): Payer: Commercial Managed Care - PPO

## 2023-10-04 ENCOUNTER — Ambulatory Visit: Payer: Commercial Managed Care - PPO | Admitting: Podiatry

## 2023-10-04 DIAGNOSIS — J309 Allergic rhinitis, unspecified: Secondary | ICD-10-CM

## 2023-10-04 NOTE — Progress Notes (Signed)
Immunotherapy   Patient Details  Name: Victoria Dunn MRN: [id number] Date of Birth: July 14, 2001  10/04/2023  Durward Fortes here to pick up  Red vial allergy shot injections.   Allergen Vial 1 Vial: MITE-CR / EXP: 09/15/24      Allergen Vial 2 Vial: POLLEN-PET / EXP: 09/15/24       Following schedule:  Frequency:1 time per week Epi-Pen:Epi-Pen Available    [facility] C. Stopher - Father of the patient picked up the red vials with the paperwork filled out by [facility].     Orson Aloe 10/04/2023, 9:55 AM

## 2023-10-06 DIAGNOSIS — Z516 Encounter for desensitization to allergens: Secondary | ICD-10-CM | POA: Diagnosis not present

## 2023-10-11 ENCOUNTER — Ambulatory Visit (INDEPENDENT_AMBULATORY_CARE_PROVIDER_SITE_OTHER): Payer: Commercial Managed Care - PPO

## 2023-10-11 ENCOUNTER — Ambulatory Visit: Payer: Commercial Managed Care - PPO | Admitting: Podiatry

## 2023-10-11 ENCOUNTER — Encounter: Payer: Self-pay | Admitting: Podiatry

## 2023-10-11 DIAGNOSIS — M67472 Ganglion, left ankle and foot: Secondary | ICD-10-CM | POA: Diagnosis not present

## 2023-10-11 NOTE — Progress Notes (Signed)
Chief Complaint  Patient presents with   Foot Pain    Patient is here for reoccurring cyst on left foot    HPI: 22 y.o. female presenting today for recurrence of a ganglion cystic lesion to the dorsum of the left foot.  History of surgical excision of a ganglion cyst to this exact same area 08/27/2022.  Has significant tenderness and pain with swelling to the area especially in certain shoes.  Past Medical History:  Diagnosis Date   Angio-edema    Tachycardia    Urticaria     Past Surgical History:  Procedure Laterality Date   BREAST REDUCTION SURGERY Bilateral 03/2022   FOOT SURGERY Bilateral 10/2018   FOOT SURGERY Left 07/2022    Allergies  Allergen Reactions   Cherry Swelling    And hives   Mangifera Indica Hives   Soy Allergy Swelling    Soy, mangoes, cherries-tongue and lip edema.  Has epi pen.   Gold-Containing Drug Products Rash   Nickel Rash     Physical Exam: General: The patient is alert and oriented x3 in no acute distress.  Dermatology: Skin is warm, dry and supple bilateral lower extremities. Negative for open lesions or macerations.  Vascular: Palpable pedal pulses bilaterally. Capillary refill within normal limits.  Negative for any significant edema or erythema  Neurological: Light touch and protective threshold grossly intact  Musculoskeletal Exam: Unchanged.  No pedal deformities noted.  There continues to be a nonadherent fluctuant lesion to the dorsum of the left foot.  Mostly unchanged since last visit.  There is now a nonadherent small mass overlying the EHL tendon of the same foot consistent with ganglion cyst formation.  MR FOOT LEFT WO CONTRAST 06/13/2023 IMPRESSION: 1. Small cyst on the dorsal aspect of the foot adjacent to the medial navicular bone adjacent to the extensor hallux tendon measuring a proximally 0.4 x 0.5 x 0.9 cm. 2. Trace amount of fluid along the extensor hallucis longus tendon which may represent mild tenosynovitis. 3. No  evidence of fracture or osteonecrosis.   Assessment: 1.  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  H/o excision of ganglion cyst 08/27/2022. - Patient evaluated - Today we discussed different treatment options.  Aspiration in the office has been unsuccessful in the past.  She would rather not try and aspirate these lesions today.  Also declined cortisone injection which I do not believe would alleviate her symptoms long-term.  Ultimately I do recommend going back into remove the ganglion cyst.  After excision of the ganglion cyst recommend possible stem cell application sheet in order to help prevent recurrence -Risk benefits advantages and disadvantages of the procedure were explained in detail with the patient.  No guarantees were expressed or implied.  All patient questions were answered. -Authorization for surgery was initiated today.  Surgery will consist of excision of ganglion cyst dorsum of the left foot with possible application of stem cell -Return to clinic 1 week postop     [name], DPM [facility]  Dr. [name], DPM    [address], [place] [address]                Office [phone]  Fax (  [phone]   

## 2023-10-13 ENCOUNTER — Telehealth: Payer: Self-pay

## 2023-10-13 ENCOUNTER — Telehealth: Payer: Self-pay | Admitting: Podiatry

## 2023-10-13 DIAGNOSIS — Z516 Encounter for desensitization to allergens: Secondary | ICD-10-CM | POA: Diagnosis not present

## 2023-10-13 NOTE — Telephone Encounter (Signed)
PT CALLED TO HAVE HER SURGERY SCHEDULED WITH DR.EVANS.

## 2023-10-13 NOTE — Telephone Encounter (Signed)
Received surgery paperwork from Dr. Logan Bores. Left a message for Shiniqua to call and schedule surgery.

## 2023-10-14 ENCOUNTER — Other Ambulatory Visit (HOSPITAL_COMMUNITY): Payer: Self-pay

## 2023-10-14 MED ORDER — BUPROPION HCL ER (XL) 150 MG PO TB24
150.0000 mg | ORAL_TABLET | Freq: Every morning | ORAL | 1 refills | Status: AC
  Filled 2023-10-14: qty 30, 30d supply, fill #0

## 2023-10-18 DIAGNOSIS — Z516 Encounter for desensitization to allergens: Secondary | ICD-10-CM | POA: Diagnosis not present

## 2023-10-19 ENCOUNTER — Telehealth: Payer: Self-pay | Admitting: Podiatry

## 2023-10-19 ENCOUNTER — Encounter: Payer: Self-pay | Admitting: Nurse Practitioner

## 2023-10-19 ENCOUNTER — Ambulatory Visit (INDEPENDENT_AMBULATORY_CARE_PROVIDER_SITE_OTHER): Payer: Commercial Managed Care - PPO | Admitting: Nurse Practitioner

## 2023-10-19 VITALS — BP 110/70 | HR 108 | Temp 98.6°F | Ht 63.0 in | Wt 179.4 lb

## 2023-10-19 DIAGNOSIS — Z13220 Encounter for screening for lipoid disorders: Secondary | ICD-10-CM | POA: Diagnosis not present

## 2023-10-19 DIAGNOSIS — Z6831 Body mass index (BMI) 31.0-31.9, adult: Secondary | ICD-10-CM

## 2023-10-19 DIAGNOSIS — E6609 Other obesity due to excess calories: Secondary | ICD-10-CM | POA: Diagnosis not present

## 2023-10-19 DIAGNOSIS — F321 Major depressive disorder, single episode, moderate: Secondary | ICD-10-CM | POA: Diagnosis not present

## 2023-10-19 DIAGNOSIS — Z7689 Persons encountering health services in other specified circumstances: Secondary | ICD-10-CM

## 2023-10-19 DIAGNOSIS — H119 Unspecified disorder of conjunctiva: Secondary | ICD-10-CM

## 2023-10-19 DIAGNOSIS — Z0000 Encounter for general adult medical examination without abnormal findings: Secondary | ICD-10-CM | POA: Diagnosis not present

## 2023-10-19 DIAGNOSIS — Z2821 Immunization not carried out because of patient refusal: Secondary | ICD-10-CM

## 2023-10-19 DIAGNOSIS — E66811 Obesity, class 1: Secondary | ICD-10-CM

## 2023-10-19 DIAGNOSIS — Z113 Encounter for screening for infections with a predominantly sexual mode of transmission: Secondary | ICD-10-CM

## 2023-10-19 NOTE — Assessment & Plan Note (Signed)
Continue f/u with your school behavioral health. Continue wellbutrin. Depression screen score 2.

## 2023-10-19 NOTE — Assessment & Plan Note (Signed)
Behavior modifications discussed and diet history reviewed.   Pt will continue to exercise regularly and modify diet with low GI, plant based foods and decrease intake of processed foods.  Recommend intake of daily multivitamin, Vitamin D, and calcium.  Recommend monthly self breast exams for preventive screenings, as well as recommend immunizations that include influenza, TDAP (will check [facility])

## 2023-10-19 NOTE — Progress Notes (Signed)
[name], CMA,acting as a Neurosurgeon for Victoria Felts, FNP.,have documented all relevant documentation on the behalf of Victoria Felts, FNP,as directed by  Victoria Felts, FNP while in the presence of Victoria Felts, FNP.  Subjective:    Patient ID: Victoria Dunn , female    DOB: [birth year] , 22 y.o.   MRN: [id number]  Chief Complaint  Patient presents with   Establish Care   Annual Exam    HPI  Patient presents today to establish care and HM.  Her mother wanted her to switch providers.  Patient reports compliance with medication. Patient denies any chest pain, SOB, or headaches. Patient has no concerns today. 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 Patient was last seen by a doctor last year. Patient does see a psychiatrist for her depression - at [facility]. She is taking wellbutrin for the last month. This is new. Denies homicidal or suicidal ideations.  Patient reports she is seen by a allergist and get allergy shots every 4 weeks.   She is followed by GYN for her women's care needs.  She has not had any surgeries to her eyes. She is able to see as well but wears glasses She had bilateral breast reduction May 2023      Past Medical History:  Diagnosis Date   Allergy    Angio-edema    Asthma    Depression    GERD (gastroesophageal reflux disease)    Tachycardia    Urticaria      Family History  Problem Relation Age of Onset   Allergies Mother    Food Allergy Mother    Heart disease Father    Hyperlipidemia Father    Diabetes Paternal Grandmother    Hyperlipidemia Paternal Grandmother      Current Outpatient Medications:    buPROPion (WELLBUTRIN XL) 150 MG 24 hr tablet, Take 1 tablet (150 mg total) by mouth every morning., Disp: 30 tablet, Rfl: 1   EPINEPHrine 0.3 mg/0.3 mL IJ SOAJ injection, Inject 0.3 mg into the muscle for 1 dose, Disp: 2 each, Rfl: 1   famotidine (PEPCID) 20 MG tablet, Take 1 tablet (20 mg total) by mouth 2  (two) times daily., Disp: 60 tablet, Rfl: 3   norethindrone-ethinyl estradiol-FE (JUNEL FE 1/20) 1-20 MG-MCG tablet, Take 1 tablet by mouth daily., Disp: 84 tablet, Rfl: 4   Olopatadine-Mometasone (RYALTRIS) 665-25 MCG/ACT SUSP, Place 1-2 sprays into both nostrils 2 (two) times daily for nasal congestion or drainage, Disp: 29 g, Rfl: 5   Allergies  Allergen Reactions   Cherry Swelling    And hives   Mangifera Indica Hives   Soy Allergy Swelling    Soy, mangoes, cherries-tongue and lip edema.  Has epi pen.   Gold-Containing Drug Products Rash   Nickel Rash      The patient states she uses OCP (estrogen/progesterone) for birth control. Patient's last menstrual period was 10/07/2023.. Negative for Dysmenorrhea and Negative for Menorrhagia. Negative for: breast discharge, breast lump(s), breast pain and breast self exam. Associated symptoms include abnormal vaginal bleeding. Pertinent negatives include abnormal bleeding (hematology), anxiety, decreased libido, depression, difficulty falling sleep, dyspareunia, history of infertility, nocturia, sexual dysfunction, sleep disturbances, urinary incontinence, urinary urgency, vaginal discharge and vaginal itching. Diet regular; she does eat out a lot and stays on Campus. The patient states her exercise level is minimal - does not exercise regularly. Due to her depression had not been exercising regularly.    Marland Kitchen  The patient's tobacco use is:  Social History   Tobacco Use  Smoking Status Never  Smokeless Tobacco Never  . She has been exposed to passive smoke. The patient's alcohol use is:  Social History   Substance and Sexual Activity  Alcohol Use No  Additional information: she is not of age for PAPs  Review of Systems  Constitutional: Negative.   HENT: Negative.    Eyes: Negative.   Respiratory: Negative.    Cardiovascular: Negative.   Gastrointestinal: Negative.   Endocrine: Negative.   Genitourinary: Negative.   Musculoskeletal:  Negative.   Skin: Negative.   Allergic/Immunologic: Negative.   Neurological: Negative.   Hematological: Negative.   Psychiatric/Behavioral: Negative.       Today's Vitals   10/19/23 1419  BP: 110/70  Pulse: (!) 108  Temp: 98.6 F (37 C)  TempSrc: Oral  Weight: 179 lb 6.4 oz (81.4 kg)  Height: 5\' 3"  (1.6 m)  PainSc: 0-No pain   Body mass index is 31.78 kg/m.  Wt Readings from Last 3 Encounters:  10/19/23 179 lb 6.4 oz (81.4 kg)  07/05/23 178 lb 4.8 oz (80.9 kg)  04/14/23 169 lb 9.6 oz (76.9 kg)     Objective:  Physical Exam Vitals reviewed. Exam conducted with a chaperone present.  Constitutional:      General: She is not in acute distress.    Appearance: Normal appearance. She is well-developed. She is obese.  HENT:     Head: Normocephalic and atraumatic.     Right Ear: Hearing, tympanic membrane, ear canal and external ear normal. There is no impacted cerumen.     Left Ear: Hearing, tympanic membrane, ear canal and external ear normal. There is no impacted cerumen.     Nose: Nose normal.     Mouth/Throat:     Mouth: Mucous membranes are moist.  Eyes:     General: Lids are normal.     Extraocular Movements: Extraocular movements intact.     Conjunctiva/sclera: Conjunctivae normal.     Pupils: Pupils are equal, round, and reactive to light.     Funduscopic exam:    Right eye: No papilledema.        Left eye: No papilledema.     Comments: She has extra tissue to her left lateral conjunctivae  Neck:     Thyroid: No thyroid mass.     Vascular: No carotid bruit.  Cardiovascular:     Rate and Rhythm: Normal rate and regular rhythm.     Pulses: Normal pulses.     Heart sounds: Normal heart sounds. No murmur heard. Pulmonary:     Effort: Pulmonary effort is normal. No respiratory distress.     Breath sounds: Normal breath sounds. No wheezing.  Chest:     Chest wall: No mass.  Breasts:    Tanner Score is 5.     Right: Normal. No mass or tenderness.     Left:  Normal. No mass or tenderness.  Abdominal:     General: Abdomen is flat. Bowel sounds are normal. There is no distension.     Palpations: Abdomen is soft.     Tenderness: There is no abdominal tenderness.  Genitourinary:    Rectum: Guaiac result negative.  Musculoskeletal:        General: No swelling. Normal range of motion.     Cervical back: Full passive range of motion without pain, normal range of motion and neck supple.     Right lower leg: No edema.  Left lower leg: No edema.  Lymphadenopathy:     Upper Body:     Right upper body: No supraclavicular, axillary or pectoral adenopathy.     Left upper body: No supraclavicular, axillary or pectoral adenopathy.  Skin:    General: Skin is warm and dry.     Capillary Refill: Capillary refill takes less than 2 seconds.     Comments: Navel piercing, 2 piercings to her tongue .  Neurological:     General: No focal deficit present.     Mental Status: She is alert and oriented to person, place, and time.     Cranial Nerves: No cranial nerve deficit.     Sensory: No sensory deficit.     Motor: No weakness.  Psychiatric:        Mood and Affect: Mood normal.        Behavior: Behavior normal.        Thought Content: Thought content normal.        Judgment: Judgment normal.         Assessment And Plan:     Establishing care with new doctor, encounter for Assessment & Plan: Patient is here to establish care. Went over patient medical, family, social and surgical history. Reviewed with patient their medications and any allergies  Reviewed with patient their sexual orientation, drug/tobacco and alcohol use Dicussed any new concerns with patient  HM done today Educated patient about the importance of annual screenings and immunizations.  Advised patient to eat a healthy diet along with exercise for atleast 30-45 min at east 4-5 days of the week.    Orders: -     CBC with Differential/Platelet -     CMP14+EGFR  Encounter for  annual health examination Assessment & Plan: Behavior modifications discussed and diet history reviewed.   Pt will continue to exercise regularly and modify diet with low GI, plant based foods and decrease intake of processed foods.  Recommend intake of daily multivitamin, Vitamin D, and calcium.  Recommend monthly self breast exams for preventive screenings, as well as recommend immunizations that include influenza, TDAP (will check [facility])   Orders: -     CBC with Differential/Platelet  COVID-19 vaccination declined Assessment & Plan: Declines covid 19 vaccine. Discussed risk of covid 22 and if she changes her mind about the vaccine to call the office. Education has been provided regarding the importance of this vaccine but patient still declined. Advised may receive this vaccine at local pharmacy or Health Dept.or vaccine clinic. Aware to provide a copy of the vaccination record if obtained from local pharmacy or Health Dept.  Encouraged to take multivitamin, vitamin d, vitamin c and zinc to increase immune system. Aware can call office if would like to have vaccine here at office. Verbalized acceptance and understanding.    Class 1 obesity due to excess calories with body mass index (BMI) of 31.0 to 31.9 in adult, unspecified whether serious comorbidity present Assessment & Plan: She is encouraged to strive for BMI less than 30 to decrease cardiac risk. Advised to aim for at least 150 minutes of exercise per week.   Orders: -     Hemoglobin A1c  Tetanus, diphtheria, and acellular pertussis (Tdap) vaccination declined Assessment & Plan: Will check [facility] for her vaccines   Encounter for screening for lipid disorder -     Lipid panel  Screening for STD (sexually transmitted disease)  Unspecified disorder of conjunctiva Assessment & Plan: Left lateral conjunctivae has extra tissue present  looks like pyterygium. She is followed by opthalmology   Screening for STDs (sexually  transmitted diseases) -     Chlamydia/Gonococcus/Trichomonas, NAA  Current moderate episode of major depressive disorder without prior episode [facility]) Assessment & Plan: Continue f/u with your school behavioral health. Continue wellbutrin. Depression screen score 2.       Return for 1 year physical.  Patient was given opportunity to ask questions. Patient verbalized understanding of the plan and was able to repeat key elements of the plan. All questions were answered to their satisfaction.   Victoria Felts, FNP  I, Victoria Felts, FNP, have reviewed all documentation for this visit. The documentation on 10/19/23 for the exam, diagnosis, procedures, and orders are all accurate and complete.

## 2023-10-19 NOTE — Assessment & Plan Note (Signed)
Patient is here to establish care. Went over patient medical, family, social and surgical history. Reviewed with patient their medications and any allergies  Reviewed with patient their sexual orientation, drug/tobacco and alcohol use Dicussed any new concerns with patient  HM done today Educated patient about the importance of annual screenings and immunizations.  Advised patient to eat a healthy diet along with exercise for atleast 30-45 min at east 4-5 days of the week.

## 2023-10-19 NOTE — Assessment & Plan Note (Signed)
Will check [facility] for her vaccines

## 2023-10-19 NOTE — Patient Instructions (Signed)
Goal to exercise 150 minutes per week with at least 2 days of strength training Encouraged to park further when at the store, take stairs instead of elevators and to walk in place during commercials. Increase water intake to at least one gallon of water daily.  Health Maintenance  Topic Date Due   Chlamydia screening  Never done   HPV Vaccine (1 - 3-dose series) Never done   COVID-19 Vaccine (2 - 2023-24 season) 11/04/2023*   DTaP/Tdap/Td vaccine (1 - Tdap) 10/18/2024*   Hepatitis C Screening  10/18/2024*   HIV Screening  10/18/2024*   Flu Shot  Completed  *Topic was postponed. The date shown is not the original due date.

## 2023-10-19 NOTE — Assessment & Plan Note (Signed)

## 2023-10-19 NOTE — Assessment & Plan Note (Addendum)
Left lateral conjunctivae has extra tissue present looks like pyterygium. She is followed by opthalmology

## 2023-10-19 NOTE — Assessment & Plan Note (Signed)
She is encouraged to strive for BMI less than 30 to decrease cardiac risk. Advised to aim for at least 150 minutes of exercise per week.

## 2023-10-19 NOTE — Telephone Encounter (Signed)
DOS-11/11/2023  EXC. GANGLION CYST LT- 28090  [employer] EFFECTIVE DATE- 11/23/2022  DEDUCTIBLE- $500.00 WITH REMAINING $0.00 OOP-$7900.00 WITH REMAINING $6,388.95    PER [employer]'S AUTOMATED SYSTEM, PRIOR AUTH IS NOT REQUIRED FOR CPT CODE 73710.  CALL REFERENCE NUMBER: [phone]

## 2023-10-20 LAB — CBC WITH DIFFERENTIAL/PLATELET
Basophils Absolute: 0 10*3/uL (ref 0.0–0.2)
Basos: 0 %
EOS (ABSOLUTE): 0.1 10*3/uL (ref 0.0–0.4)
Eos: 1 %
Hematocrit: 42.3 % (ref 34.0–46.6)
Hemoglobin: 13.6 g/dL (ref 11.1–15.9)
Immature Grans (Abs): 0 10*3/uL (ref 0.0–0.1)
Immature Granulocytes: 0 %
Lymphocytes Absolute: 2.3 10*3/uL (ref 0.7–3.1)
Lymphs: 26 %
MCH: 27.9 pg (ref 26.6–33.0)
MCHC: 32.2 g/dL (ref 31.5–35.7)
MCV: 87 fL (ref 79–97)
Monocytes Absolute: 0.6 10*3/uL (ref 0.1–0.9)
Monocytes: 7 %
Neutrophils Absolute: 5.6 10*3/uL (ref 1.4–7.0)
Neutrophils: 66 %
Platelets: 337 10*3/uL (ref 150–450)
RBC: 4.88 x10E6/uL (ref 3.77–5.28)
RDW: 12.8 % (ref 11.7–15.4)
WBC: 8.7 10*3/uL (ref 3.4–10.8)

## 2023-10-20 LAB — HEMOGLOBIN A1C
Est. average glucose Bld gHb Est-mCnc: 128 mg/dL
Hgb A1c MFr Bld: 6.1 % — ABNORMAL HIGH (ref 4.8–5.6)

## 2023-10-20 LAB — CMP14+EGFR
ALT: 37 [IU]/L — ABNORMAL HIGH (ref 0–32)
AST: 20 [IU]/L (ref 0–40)
Albumin: 4.1 g/dL (ref 4.0–5.0)
Alkaline Phosphatase: 97 [IU]/L (ref 42–106)
BUN/Creatinine Ratio: 9 (ref 9–23)
BUN: 7 mg/dL (ref 6–20)
Bilirubin Total: 0.4 mg/dL (ref 0.0–1.2)
CO2: 23 mmol/L (ref 20–29)
Calcium: 9.6 mg/dL (ref 8.7–10.2)
Chloride: 101 mmol/L (ref 96–106)
Creatinine, Ser: 0.81 mg/dL (ref 0.57–1.00)
Globulin, Total: 2.7 g/dL (ref 1.5–4.5)
Glucose: 83 mg/dL (ref 70–99)
Potassium: 4.4 mmol/L (ref 3.5–5.2)
Sodium: 141 mmol/L (ref 134–144)
Total Protein: 6.8 g/dL (ref 6.0–8.5)
eGFR: 107 mL/min/{1.73_m2} (ref >59)

## 2023-10-20 LAB — LIPID PANEL
Chol/HDL Ratio: 2.7 ratio (ref 0.0–4.4)
Cholesterol, Total: 161 mg/dL (ref 100–199)
HDL: 60 mg/dL
LDL Chol Calc (NIH): 85 mg/dL (ref 0–99)
Triglycerides: 84 mg/dL (ref 0–149)
VLDL Cholesterol Cal: 16 mg/dL (ref 5–40)

## 2023-10-20 LAB — CHLAMYDIA/GONOCOCCUS/TRICHOMONAS, NAA
Chlamydia by NAA: NEGATIVE
Gonococcus by NAA: NEGATIVE
Trich vag by NAA: NEGATIVE

## 2023-11-05 ENCOUNTER — Encounter: Payer: Self-pay | Admitting: Allergy

## 2023-11-05 ENCOUNTER — Other Ambulatory Visit: Payer: Self-pay

## 2023-11-05 ENCOUNTER — Ambulatory Visit: Payer: Commercial Managed Care - PPO | Admitting: Allergy

## 2023-11-05 VITALS — BP 126/90 | HR 85 | Temp 98.3°F | Resp 12

## 2023-11-05 DIAGNOSIS — J3089 Other allergic rhinitis: Secondary | ICD-10-CM | POA: Diagnosis not present

## 2023-11-05 DIAGNOSIS — T781XXD Other adverse food reactions, not elsewhere classified, subsequent encounter: Secondary | ICD-10-CM

## 2023-11-05 DIAGNOSIS — L501 Idiopathic urticaria: Secondary | ICD-10-CM

## 2023-11-05 DIAGNOSIS — J302 Other seasonal allergic rhinitis: Secondary | ICD-10-CM

## 2023-11-05 DIAGNOSIS — H1013 Acute atopic conjunctivitis, bilateral: Secondary | ICD-10-CM | POA: Diagnosis not present

## 2023-11-05 DIAGNOSIS — T7800XA Anaphylactic reaction due to unspecified food, initial encounter: Secondary | ICD-10-CM

## 2023-11-05 NOTE — Patient Instructions (Addendum)
Allergic rhinitis Well-controlled Continue allergen avoidance measures directed toward dust mite, cockroach, pollen, and pets  Continue allergen immunotherapy at maintenance dosing! Have access to an epinephrine autoinjector set per protocol Continue cetirizine 10 mg once a day as needed for runny nose or itch Continue Ryaltris 2 sprays in each nostril once or twice a day as needed for nasal symptoms Consider saline nasal rinses as needed for nasal symptoms. Use this before any medicated nasal sprays for best result  Allergic conjunctivitis Well-controlled Some over the counter eye drops include Pataday one drop in each eye once a day as needed for red, itchy eyes OR Zaditor one drop in each eye twice a day as needed for red itchy eyes. Avoid eye drops that say red eye relief as they may contain medications that dry out your eyes.   Hives (urticaria) Stable Take the least amount of medications while remaining hive free Cetirizine (Zyrtec) 10mg  twice a day and famotidine (Pepcid) 20 mg twice a day. If no symptoms for 7-14 days then decrease to. Cetirizine (Zyrtec) 10mg  twice a day and famotidine (Pepcid) 20 mg once a day.  If no symptoms for 7-14 days then decrease to. Cetirizine (Zyrtec) 10mg  twice a day.  If no symptoms for 7-14 days then decrease to. Cetirizine (Zyrtec) 10mg  once a day.  May use Benadryl (diphenhydramine) as needed for breakthrough hives       If symptoms return, then step up dosage Keep a detailed symptom journal including foods eaten, contact with allergens, medications taken, weather changes.   Food allergy Stable Continue to avoid soy, peach, cherry, and mango.  In case of an allergic reaction, give Benadryl 50 mg every 4 hours, and if life-threatening symptoms occur, inject with EpiPen 0.3 mg.  Oral allergy syndrome Continue to avoid fresh apple and fruits that bother your mouth.  Okay to continue with applesauce and apple juice as these are not bothersome Can  try these fruits with slow re-introduction next year if allergy symptoms are less during pollen season and/or you are needed less medications.  These will both be good signs that pollen allergy is resolving.  The oral allergy syndrome (OAS) or pollen-food allergy syndrome (PFAS) is a relatively common form of food allergy, particularly in adults. It typically occurs in people who have pollen allergies when the immune system "sees" proteins on the food that look like proteins on the pollen. This results in the allergy antibody (IgE) binding to the food instead of the pollen. Patients typically report itching and/or mild swelling of the mouth and throat immediately following ingestion of certain uncooked fruits (including nuts) or raw vegetables. Only a very small number of affected individuals experience systemic allergic reactions, such as anaphylaxis which occurs with true food allergies.       Call the clinic if this treatment plan is not working well for you  Follow up in 6 months or sooner if needed.

## 2023-11-05 NOTE — Progress Notes (Signed)
Follow-up Note  RE: [name] MRN: [id number] DOB: Oct 26, 2001 Date of Office Visit: 11/05/2023   History of present illness: Victoria Dunn is a 22 y.o. female presenting today for follow-up of allergic rhinitis with conjunctivitis, hives, food allergy, OAS.  She was last seen in the office on 07/05/23 by our NP Ambs. She presents with her mother.  Discussed the use of AI scribe software for clinical note transcription with the patient, who gave verbal consent to proceed.  The patient, who has been receiving allergy shots since the summer, reports no major health changes, surgeries, or hospitalizations since August. She has been tolerating the allergy shots well at school, with no significant reactions or large locals. She is now in the maintenance phase of the allergy shots and has started the once-a-month dosing.  She has access to epinephrine device.   The patient has noticed a significant improvement in her allergy symptoms, particularly with dogs, and is eager to try consuming peaches again, which she has previously had a oral reaction to.   In terms of daily medications, the patient is on Ryaltris and an antihistamine. She has not needed to use her EpiPen or any eye drops, and she has not experienced any hives. She recently had a cold but did not require antibiotics or any urgent care. The patient has received the COVID vaccine and booster but has not had latest booster.  She has received flu vaccine for the season.     Review of systems: 10pt ROS negative unless noted above in HPI   All other systems negative unless noted above in HPI  Past medical/social/surgical/family history have been reviewed and are unchanged unless specifically indicated below.  No changes  Medication List: Current Outpatient Medications  Medication Sig Dispense Refill   buPROPion (WELLBUTRIN XL) 150 MG 24 hr tablet Take 1 tablet (150 mg total) by mouth every morning. 30 tablet 1   EPINEPHrine 0.3  mg/0.3 mL IJ SOAJ injection Inject 0.3 mg into the muscle for 1 dose 2 each 1   famotidine (PEPCID) 20 MG tablet Take 1 tablet (20 mg total) by mouth 2 (two) times daily. 60 tablet 3   norethindrone-ethinyl estradiol-FE (JUNEL FE 1/20) 1-20 MG-MCG tablet Take 1 tablet by mouth daily. 84 tablet 4   Olopatadine-Mometasone (RYALTRIS) 665-25 MCG/ACT SUSP Place 1-2 sprays into both nostrils 2 (two) times daily for nasal congestion or drainage 29 g 5   No current facility-administered medications for this visit.     Known medication allergies: Allergies  Allergen Reactions   Cherry Swelling    And hives   Mangifera Indica Hives   Soy Allergy (Do Not Select) Swelling    Soy, mangoes, cherries-tongue and lip edema.  Has epi pen.   Gold-Containing Drug Products Rash   Nickel Rash     Physical examination: Blood pressure (!) 126/90, pulse 85, temperature 98.3 F (36.8 C), temperature source Temporal, resp. rate 12, last menstrual period 10/07/2023, SpO2 100%.  General: Alert, interactive, in no acute distress. HEENT: PERRLA, TMs pearly gray, turbinates non-edematous without discharge, post-pharynx non erythematous. Neck: Supple without lymphadenopathy. Lungs: Clear to auscultation without wheezing, rhonchi or rales. {no increased work of breathing. CV: Normal S1, S2 without murmurs. Abdomen: Nondistended, nontender. Skin: Warm and dry, without lesions or rashes. Extremities:  No clubbing, cyanosis or edema. Neuro:   Grossly intact.  Diagnositics/Labs: None today  Assessment and plan:   Allergic rhinitis Well-controlled Continue allergen avoidance measures directed toward dust mite,  cockroach, pollen, and pets  Continue allergen immunotherapy at maintenance dosing! Have access to an epinephrine autoinjector set per protocol Continue cetirizine 10 mg once a day as needed for runny nose or itch Continue Ryaltris 2 sprays in each nostril once or twice a day as needed for nasal  symptoms Consider saline nasal rinses as needed for nasal symptoms. Use this before any medicated nasal sprays for best result  Allergic conjunctivitis Well-controlled Some over the counter eye drops include Pataday one drop in each eye once a day as needed for red, itchy eyes OR Zaditor one drop in each eye twice a day as needed for red itchy eyes. Avoid eye drops that say red eye relief as they may contain medications that dry out your eyes.   Hives (urticaria) Stable Take the least amount of medications while remaining hive free Cetirizine (Zyrtec) 10mg  twice a day and famotidine (Pepcid) 20 mg twice a day. If no symptoms for 7-14 days then decrease to. Cetirizine (Zyrtec) 10mg  twice a day and famotidine (Pepcid) 20 mg once a day.  If no symptoms for 7-14 days then decrease to. Cetirizine (Zyrtec) 10mg  twice a day.  If no symptoms for 7-14 days then decrease to. Cetirizine (Zyrtec) 10mg  once a day.  May use Benadryl (diphenhydramine) as needed for breakthrough hives       If symptoms return, then step up dosage Keep a detailed symptom journal including foods eaten, contact with allergens, medications taken, weather changes.   Food allergy Stable Continue to avoid soy, peach, cherry, and mango.  In case of an allergic reaction, give Benadryl 50 mg every 4 hours, and if life-threatening symptoms occur, inject with EpiPen 0.3 mg.  Oral allergy syndrome Continue to avoid fresh apple and fruits that bother your mouth.  Okay to continue with applesauce and apple juice as these are not bothersome. Can try these fruits with slow re-introduction next year if allergy symptoms are less during pollen season and/or you are needing less medications.  These will both be good signs that pollen allergy is resolving.  The oral allergy syndrome (OAS) or pollen-food allergy syndrome (PFAS) is a relatively common form of food allergy, particularly in adults. It typically occurs in people who have pollen  allergies when the immune system "sees" proteins on the food that look like proteins on the pollen. This results in the allergy antibody (IgE) binding to the food instead of the pollen. Patients typically report itching and/or mild swelling of the mouth and throat immediately following ingestion of certain uncooked fruits (including nuts) or raw vegetables. Only a very small number of affected individuals experience systemic allergic reactions, such as anaphylaxis which occurs with true food allergies.       Call the clinic if this treatment plan is not working well for you  Follow up in 6 months or sooner if needed.   I appreciate the opportunity to take part in Rox's care. Please do not hesitate to contact me with questions.  Sincerely,   Margo Aye, MD Allergy/Immunology [facility]

## 2023-11-09 ENCOUNTER — Ambulatory Visit (INDEPENDENT_AMBULATORY_CARE_PROVIDER_SITE_OTHER): Payer: Self-pay | Admitting: *Deleted

## 2023-11-09 DIAGNOSIS — J309 Allergic rhinitis, unspecified: Secondary | ICD-10-CM | POA: Diagnosis not present

## 2023-11-11 ENCOUNTER — Other Ambulatory Visit (HOSPITAL_COMMUNITY): Payer: Self-pay

## 2023-11-11 ENCOUNTER — Other Ambulatory Visit: Payer: Self-pay | Admitting: Podiatry

## 2023-11-11 DIAGNOSIS — M67472 Ganglion, left ankle and foot: Secondary | ICD-10-CM | POA: Diagnosis not present

## 2023-11-11 MED ORDER — IBUPROFEN 800 MG PO TABS
800.0000 mg | ORAL_TABLET | Freq: Three times a day (TID) | ORAL | 1 refills | Status: AC
  Filled 2023-11-11: qty 60, 20d supply, fill #0

## 2023-11-11 MED ORDER — OXYCODONE-ACETAMINOPHEN 5-325 MG PO TABS
1.0000 | ORAL_TABLET | ORAL | 0 refills | Status: DC | PRN
  Filled 2023-11-11: qty 30, 5d supply, fill #0

## 2023-11-11 NOTE — Progress Notes (Signed)
PRN postop 

## 2023-11-18 ENCOUNTER — Encounter: Payer: Commercial Managed Care - PPO | Admitting: Podiatry

## 2023-11-22 ENCOUNTER — Encounter: Payer: Self-pay | Admitting: Podiatry

## 2023-11-22 ENCOUNTER — Ambulatory Visit (INDEPENDENT_AMBULATORY_CARE_PROVIDER_SITE_OTHER): Payer: Commercial Managed Care - PPO | Admitting: Podiatry

## 2023-11-22 ENCOUNTER — Ambulatory Visit (INDEPENDENT_AMBULATORY_CARE_PROVIDER_SITE_OTHER): Payer: Commercial Managed Care - PPO

## 2023-11-22 DIAGNOSIS — Z9889 Other specified postprocedural states: Secondary | ICD-10-CM

## 2023-11-22 DIAGNOSIS — M778 Other enthesopathies, not elsewhere classified: Secondary | ICD-10-CM

## 2023-11-29 ENCOUNTER — Ambulatory Visit (INDEPENDENT_AMBULATORY_CARE_PROVIDER_SITE_OTHER): Payer: Commercial Managed Care - PPO | Admitting: Podiatry

## 2023-11-29 ENCOUNTER — Encounter: Payer: Self-pay | Admitting: Podiatry

## 2023-11-29 ENCOUNTER — Other Ambulatory Visit (HOSPITAL_COMMUNITY): Payer: Self-pay

## 2023-11-29 DIAGNOSIS — M67472 Ganglion, left ankle and foot: Secondary | ICD-10-CM

## 2023-11-29 MED ORDER — DOXYCYCLINE HYCLATE 100 MG PO TABS
100.0000 mg | ORAL_TABLET | Freq: Two times a day (BID) | ORAL | 0 refills | Status: DC
  Filled 2023-11-29: qty 20, 10d supply, fill #0

## 2023-11-29 NOTE — Progress Notes (Signed)
   Chief Complaint  Patient presents with   Routine Post Op    Patient state everything has been good since last visit , patient has not been taking medication for pain    Subjective:  Patient presents today status post excision of ganglion cyst left foot with application of amniotic tissue graft and cortisone injection along the more superficial incision site to prevent keloid formation.  DOS: 11/11/2023.  Patient continues to do well.  No pain.  WBAT as tolerated in regular shoes and sneakers  Past Medical History:  Diagnosis Date   Allergy     Angio-edema    Asthma    Depression    GERD (gastroesophageal reflux disease)    Tachycardia    Urticaria     Past Surgical History:  Procedure Laterality Date   BREAST REDUCTION SURGERY Bilateral 03/2022   BREAST SURGERY     FOOT SURGERY Bilateral 10/2018   FOOT SURGERY Left 07/2022    Allergies  Allergen Reactions   Cherry Swelling    And hives   Mangifera Indica Hives   Soy Allergy  (Do Not Select) Swelling    Soy, mangoes, cherries-tongue and lip edema.  Has epi pen.   Gold-Containing Drug Products Rash   Nickel Rash    Objective/Physical Exam Neurovascular status intact.  Incision well coapted with sutures intact. No sign of infectious process noted. No dehiscence. No active bleeding noted.  There is some small amount of serous/completely clear fluid coming from the more proximal portion of the incision site.  It appeared to have a low viscosity.  No purulence or discoloration and there is no surrounding erythema and edema.  Radiographic Exam LT foot 11/22/2023:  Unchanged.  Normal osseous mineralization.  Otherwise normal exam  Assessment: 1. s/p excision of ganglion cyst with application of amniotic tissue graft and cortisone injection along the more superficial incision site for prevention of keloid formation. DOS: 11/11/2023   Plan of Care:  -Patient was evaluated.  -Sutures removed.  Silvadene cream was provided to  apply daily with a light dressing -There was some serous drainage coming from the more proximal incision site.  It is very clear with low viscosity.  Just to be safe I did call in a prescription for doxycycline  100 mg x 10 days -Patient is returning to school at [employer] beginning next week.  She will be doing excessive mild walking around campus.  She will use the knee scooter to minimize pressure to the foot -Handicap parking placard form also provided -Recommend daily compression for the next 4-6 weeks.  Ace wrap's and compression ankle sleeves dispensed and provided -Return to clinic as needed.  Patient will continue to keep me informed via MyChart if she has any issues or needs assistance  Thresa EMERSON Sar, DPM [facility]  Dr. Thresa EMERSON Sar, DPM    [address] [place], [place] [address]                Office [phone]  Fax [phone]

## 2023-11-30 ENCOUNTER — Encounter: Payer: Self-pay | Admitting: Podiatry

## 2023-11-30 ENCOUNTER — Other Ambulatory Visit (HOSPITAL_COMMUNITY): Payer: Self-pay

## 2023-12-03 ENCOUNTER — Other Ambulatory Visit: Payer: Self-pay | Admitting: Podiatry

## 2023-12-03 ENCOUNTER — Encounter: Payer: Self-pay | Admitting: Podiatry

## 2023-12-03 ENCOUNTER — Ambulatory Visit (INDEPENDENT_AMBULATORY_CARE_PROVIDER_SITE_OTHER): Payer: Commercial Managed Care - PPO | Admitting: Podiatry

## 2023-12-03 DIAGNOSIS — M67472 Ganglion, left ankle and foot: Secondary | ICD-10-CM

## 2023-12-03 DIAGNOSIS — M67442 Ganglion, left hand: Secondary | ICD-10-CM | POA: Diagnosis not present

## 2023-12-03 NOTE — Progress Notes (Signed)
   Chief Complaint  Patient presents with   Post-op Problem    DOS 11/11/23 --- EXCISION GANGLION CYST LEFT FOOT It's doing okay.    Subjective:  Patient presents today status post excision of ganglion cyst left foot with application of amniotic tissue graft and cortisone injection along the more superficial incision site to prevent keloid formation.  DOS: 11/11/2023.  Of the last few days the patient has noticed increased fluid collection just underneath the incision site.  She presents today for an urgent work and just prior to her returning to school tomorrow  Past Medical History:  Diagnosis Date   Allergy     Angio-edema    Asthma    Depression    GERD (gastroesophageal reflux disease)    Tachycardia    Urticaria     Past Surgical History:  Procedure Laterality Date   BREAST REDUCTION SURGERY Bilateral 03/2022   BREAST SURGERY     FOOT SURGERY Bilateral 10/2018   FOOT SURGERY Left 07/2022    Allergies  Allergen Reactions   Cherry Swelling    And hives   Mangifera Indica Hives   Soy Allergy  (Do Not Select) Swelling    Soy, mangoes, cherries-tongue and lip edema.  Has epi pen.   Gold-Containing Drug Products Rash   Nickel Rash    Objective/Physical Exam Neurovascular status intact.  Incision is nicely healed.  Today there is no drainage to the area but there is some very superficial fluctuance just underlying the incision site consistent with likely seroma  Radiographic Exam LT foot 11/22/2023:  Unchanged.  Normal osseous mineralization.  Otherwise normal exam  Assessment: 1. s/p excision of ganglion cyst with application of amniotic tissue graft and cortisone injection along the more superficial incision site for prevention of keloid formation. DOS: 11/11/2023 2.  Postoperative seroma along the dorsal incision site  Plan of Care:  -Patient was evaluated.  -Due to the recurrence of the superficial seroma along the dorsum of the incision site I did perforate  the most distal part of the incision site and clear fluid was drained, approximately 2 mL.  Cultures were taken and sent to pathology although I do not believe this is infection related.  There is no erythema or purulence.  It is very clear serous fluid. - Continue doxycycline  100 mg x 10 days until completed -Patient is returning to school at [employer] beginning next week.  She will be doing excessive mild walking around campus.  She will use the knee scooter to minimize pressure to the foot -Continue compression to the foot daily to prevent seroma formation - Return to clinic 2 weeks  Thresa EMERSON Sar, DPM [facility]  Dr. Thresa EMERSON Sar, DPM    [address] [place], [place] [address]                Office [phone]  Fax [phone]

## 2023-12-03 NOTE — Addendum Note (Signed)
 Addended by: Enedina Finner on: 12/03/2023 01:02 PM   Modules accepted: Orders

## 2023-12-08 DIAGNOSIS — Z516 Encounter for desensitization to allergens: Secondary | ICD-10-CM | POA: Diagnosis not present

## 2023-12-09 LAB — WOUND CULTURE: Organism ID, Bacteria: NONE SEEN

## 2023-12-13 ENCOUNTER — Encounter: Payer: Commercial Managed Care - PPO | Admitting: Podiatry

## 2023-12-20 DIAGNOSIS — F19980 Other psychoactive substance use, unspecified with psychoactive substance-induced anxiety disorder: Secondary | ICD-10-CM | POA: Diagnosis not present

## 2023-12-20 DIAGNOSIS — F419 Anxiety disorder, unspecified: Secondary | ICD-10-CM | POA: Diagnosis not present

## 2023-12-20 DIAGNOSIS — H509 Unspecified strabismus: Secondary | ICD-10-CM | POA: Diagnosis not present

## 2023-12-20 DIAGNOSIS — T50901A Poisoning by unspecified drugs, medicaments and biological substances, accidental (unintentional), initial encounter: Secondary | ICD-10-CM | POA: Diagnosis not present

## 2023-12-20 DIAGNOSIS — F12980 Cannabis use, unspecified with anxiety disorder: Secondary | ICD-10-CM | POA: Diagnosis not present

## 2023-12-20 DIAGNOSIS — R000 Tachycardia, unspecified: Secondary | ICD-10-CM | POA: Diagnosis not present

## 2023-12-20 DIAGNOSIS — R55 Syncope and collapse: Secondary | ICD-10-CM | POA: Diagnosis not present

## 2023-12-20 DIAGNOSIS — R1110 Vomiting, unspecified: Secondary | ICD-10-CM | POA: Diagnosis not present

## 2023-12-21 DIAGNOSIS — R55 Syncope and collapse: Secondary | ICD-10-CM | POA: Diagnosis not present

## 2023-12-21 DIAGNOSIS — F419 Anxiety disorder, unspecified: Secondary | ICD-10-CM | POA: Diagnosis not present

## 2023-12-21 DIAGNOSIS — F19980 Other psychoactive substance use, unspecified with psychoactive substance-induced anxiety disorder: Secondary | ICD-10-CM | POA: Diagnosis not present

## 2023-12-24 ENCOUNTER — Ambulatory Visit (INDEPENDENT_AMBULATORY_CARE_PROVIDER_SITE_OTHER): Payer: Commercial Managed Care - PPO | Admitting: Podiatry

## 2023-12-24 ENCOUNTER — Encounter: Payer: Self-pay | Admitting: Podiatry

## 2023-12-24 ENCOUNTER — Ambulatory Visit: Payer: Commercial Managed Care - PPO | Admitting: Family Medicine

## 2023-12-24 VITALS — Ht 63.0 in | Wt 179.4 lb

## 2023-12-24 DIAGNOSIS — M67472 Ganglion, left ankle and foot: Secondary | ICD-10-CM

## 2023-12-31 ENCOUNTER — Other Ambulatory Visit: Payer: Self-pay | Admitting: Medical Genetics

## 2024-01-03 DIAGNOSIS — J09X2 Influenza due to identified novel influenza A virus with other respiratory manifestations: Secondary | ICD-10-CM | POA: Diagnosis not present

## 2024-01-10 DIAGNOSIS — Z516 Encounter for desensitization to allergens: Secondary | ICD-10-CM | POA: Diagnosis not present

## 2024-01-18 ENCOUNTER — Other Ambulatory Visit (HOSPITAL_COMMUNITY): Payer: Self-pay

## 2024-01-27 ENCOUNTER — Other Ambulatory Visit (HOSPITAL_COMMUNITY): Payer: Self-pay

## 2024-02-01 ENCOUNTER — Ambulatory Visit (INDEPENDENT_AMBULATORY_CARE_PROVIDER_SITE_OTHER): Payer: Commercial Managed Care - PPO | Admitting: Podiatry

## 2024-02-01 ENCOUNTER — Encounter: Payer: Self-pay | Admitting: Podiatry

## 2024-02-01 ENCOUNTER — Ambulatory Visit (INDEPENDENT_AMBULATORY_CARE_PROVIDER_SITE_OTHER)

## 2024-02-01 DIAGNOSIS — Z9889 Other specified postprocedural states: Secondary | ICD-10-CM

## 2024-02-01 NOTE — Progress Notes (Signed)
   Chief Complaint  Patient presents with   Routine Post Op    POV 4 DOS 11/11/23 --- EXCISION GANGLION CYST LEFT FOOT "It's not good.  It hurts, my pain level is a four."     Subjective:  Patient presents today status post excision of ganglion cyst left foot with application of amniotic tissue graft and cortisone injection along the more superficial incision site to prevent keloid formation.  DOS: 11/11/2023.  Unfortunately the lesion has completely returned.  Is very symptomatic and painful.  She has a second opinion scheduled for 02/15/2024 in [place], [place] podiatry  Past Medical History:  Diagnosis Date   Allergy    Angio-edema    Asthma    Depression    GERD (gastroesophageal reflux disease)    Tachycardia    Urticaria     Past Surgical History:  Procedure Laterality Date   BREAST REDUCTION SURGERY Bilateral 03/2022   BREAST SURGERY     FOOT SURGERY Bilateral 10/2018   FOOT SURGERY Left 07/2022    Allergies  Allergen Reactions   Cherry Swelling    And hives   Mangifera Indica Hives   Soy Allergy (Obsolete) Swelling    Soy, mangoes, cherries-tongue and lip edema.  Has epi pen.   Gold-Containing Drug Products Rash   Nickel Rash    Objective/Physical Exam Neurovascular status intact.  Incision  healed.  More superficial not adhered lesion which now appears larger to the dorsum of the foot just underlying the incision site.  Consistent with recurrent ganglion cyst.  Associated tenderness to palpation  Radiographic Exam LT foot 11/22/2023:  Unchanged.  Normal osseous mineralization.  Otherwise normal exam  Assessment: 1. s/p excision of ganglion cyst with application of amniotic tissue graft and cortisone injection along the more superficial incision site for prevention of keloid formation. DOS: 11/11/2023 2.  Postoperative seroma along the dorsal incision site  Plan of Care:  -Patient was evaluated.   -Decision was made not to drain the lesion today since she  has a second opinion consult with [place], [place] podiatry.  Greatly appreciated -Patient has a second opinion with podiatry in [place], [place] on 02/15/2024. -Currently I believe the next steps would be repeat MRI and possible arthrodesis to the midtarsal joints that are creating the recurrent ganglion cyst. -Patient will reach out to me after second opinion consult in [place] to discuss next steps -Currently no scheduled follow-ups on file  [name], DPM [facility]  Dr. [name], DPM    [address] [place], [place] [address]                Office [phone]  Fax [phone]

## 2024-02-03 ENCOUNTER — Other Ambulatory Visit (HOSPITAL_COMMUNITY): Payer: Commercial Managed Care - PPO

## 2024-02-03 ENCOUNTER — Other Ambulatory Visit (HOSPITAL_COMMUNITY): Payer: Self-pay

## 2024-02-04 ENCOUNTER — Other Ambulatory Visit (HOSPITAL_COMMUNITY)
Admission: RE | Admit: 2024-02-04 | Discharge: 2024-02-04 | Disposition: A | Discharge status: Home / Self Care | Payer: Self-pay | Source: Ambulatory Visit | Attending: Medical Genetics | Admitting: Medical Genetics

## 2024-02-04 ENCOUNTER — Encounter: Payer: Commercial Managed Care - PPO | Admitting: Podiatry

## 2024-02-08 DIAGNOSIS — Z516 Encounter for desensitization to allergens: Secondary | ICD-10-CM | POA: Diagnosis not present

## 2024-02-15 ENCOUNTER — Encounter: Payer: Self-pay | Admitting: Podiatry

## 2024-02-15 DIAGNOSIS — M79672 Pain in left foot: Secondary | ICD-10-CM | POA: Diagnosis not present

## 2024-02-15 DIAGNOSIS — M67472 Ganglion, left ankle and foot: Secondary | ICD-10-CM | POA: Diagnosis not present

## 2024-02-15 LAB — GENECONNECT MOLECULAR SCREEN: Genetic Analysis Overall Interpretation: NEGATIVE

## 2024-03-01 DIAGNOSIS — M67472 Ganglion, left ankle and foot: Secondary | ICD-10-CM | POA: Diagnosis not present

## 2024-03-01 DIAGNOSIS — M79672 Pain in left foot: Secondary | ICD-10-CM | POA: Diagnosis not present

## 2024-03-07 DIAGNOSIS — Z516 Encounter for desensitization to allergens: Secondary | ICD-10-CM | POA: Diagnosis not present

## 2024-03-16 ENCOUNTER — Telehealth: Payer: Self-pay

## 2024-03-16 NOTE — Telephone Encounter (Signed)
 Received allergy  injection records and request for more serum. Left patient a message to call the office. We will need to schedule a mail out or a pick up in 3 weeks.

## 2024-03-20 DIAGNOSIS — J3081 Allergic rhinitis due to animal (cat) (dog) hair and dander: Secondary | ICD-10-CM | POA: Diagnosis not present

## 2024-03-20 NOTE — Progress Notes (Signed)
 VIALS MADE 03-20-24. EXP 03-20-25

## 2024-03-21 DIAGNOSIS — J3089 Other allergic rhinitis: Secondary | ICD-10-CM | POA: Diagnosis not present

## 2024-03-31 NOTE — Telephone Encounter (Signed)
 Patient has made an appointment to pick up her red vials. Patient informed me that she may not be able to pick them up and if not will have Jashawn Thigpen pick them up. Please be sure to ask for ID when vials are pick up if it is not the patient picking them up.

## 2024-04-04 DIAGNOSIS — Z516 Encounter for desensitization to allergens: Secondary | ICD-10-CM | POA: Diagnosis not present

## 2024-04-06 ENCOUNTER — Other Ambulatory Visit: Payer: Self-pay | Admitting: Allergy

## 2024-04-06 ENCOUNTER — Other Ambulatory Visit (HOSPITAL_COMMUNITY): Payer: Self-pay

## 2024-04-06 MED ORDER — FAMOTIDINE 20 MG PO TABS
20.0000 mg | ORAL_TABLET | Freq: Two times a day (BID) | ORAL | 3 refills | Status: AC
  Filled 2024-04-06 – 2024-04-18 (×3): qty 60, 30d supply, fill #0
  Filled 2024-07-05 – 2024-08-15 (×3): qty 60, 30d supply, fill #1
  Filled 2024-12-12: qty 60, 30d supply, fill #2

## 2024-04-07 ENCOUNTER — Other Ambulatory Visit: Payer: Self-pay

## 2024-04-12 ENCOUNTER — Other Ambulatory Visit: Payer: Self-pay

## 2024-04-18 ENCOUNTER — Other Ambulatory Visit (HOSPITAL_COMMUNITY): Payer: Self-pay

## 2024-04-20 ENCOUNTER — Other Ambulatory Visit (HOSPITAL_COMMUNITY): Payer: Self-pay

## 2024-04-21 ENCOUNTER — Ambulatory Visit

## 2024-04-21 DIAGNOSIS — J309 Allergic rhinitis, unspecified: Secondary | ICD-10-CM

## 2024-04-21 NOTE — Progress Notes (Signed)
 Immunotherapy   Patient Details  Name: Victoria Dunn MRN: [id number] Date of Birth: Dec 01, 2000  04/21/2024  Babara Bolls Renteria spouse here to pick up Red vials for Pollen-Pet and Mite-Roach. Following schedule: C  Frequency:0.1, 0.3, 0.5 weekly then every 4 weeks Epi-Pen:Epi-Pen Available  Consent signed and patient instructions given. Patient will continue to get allergy  injections at [facility].    Denton Flakes 04/21/2024, 11:17 AM

## 2024-04-27 DIAGNOSIS — M67472 Ganglion, left ankle and foot: Secondary | ICD-10-CM | POA: Diagnosis not present

## 2024-04-27 DIAGNOSIS — M79672 Pain in left foot: Secondary | ICD-10-CM | POA: Diagnosis not present

## 2024-05-05 ENCOUNTER — Other Ambulatory Visit: Payer: Self-pay

## 2024-05-05 ENCOUNTER — Ambulatory Visit: Payer: Commercial Managed Care - PPO | Admitting: Allergy

## 2024-05-05 ENCOUNTER — Encounter: Payer: Self-pay | Admitting: Allergy

## 2024-05-05 VITALS — BP 100/78 | HR 100 | Temp 98.1°F | Resp 16 | Ht 62.99 in | Wt 181.2 lb

## 2024-05-05 DIAGNOSIS — T781XXD Other adverse food reactions, not elsewhere classified, subsequent encounter: Secondary | ICD-10-CM

## 2024-05-05 DIAGNOSIS — J309 Allergic rhinitis, unspecified: Secondary | ICD-10-CM

## 2024-05-05 DIAGNOSIS — J302 Other seasonal allergic rhinitis: Secondary | ICD-10-CM | POA: Diagnosis not present

## 2024-05-05 DIAGNOSIS — J3089 Other allergic rhinitis: Secondary | ICD-10-CM

## 2024-05-05 DIAGNOSIS — H1013 Acute atopic conjunctivitis, bilateral: Secondary | ICD-10-CM

## 2024-05-05 DIAGNOSIS — L501 Idiopathic urticaria: Secondary | ICD-10-CM

## 2024-05-05 DIAGNOSIS — T7800XA Anaphylactic reaction due to unspecified food, initial encounter: Secondary | ICD-10-CM

## 2024-05-05 MED ORDER — RYALTRIS 665-25 MCG/ACT NA SUSP: 1.0000 | Freq: Two times a day (BID) | NASAL | 5 refills | Status: DC

## 2024-05-05 MED ORDER — NEFFY 2 MG/0.1ML NA SOLN: 1.0000 | NASAL | 1 refills | Status: DC | PRN

## 2024-05-05 NOTE — Progress Notes (Unsigned)
 Follow-up Note  RE: KENLYNN HOUDE MRN: [id number] DOB: [birth year] Date of Office Visit: 05/05/2024   History of present illness: Victoria Dunn is a 23 y.o. female presenting today for follow-up of allergic rhinitis with conjunctivitis, urticaria, food allergy , oral allergy  syndrome.  She was last seen in the office on 11/05/2023 by myself. Discussed the use of AI scribe software for clinical note transcription with the patient, who gave verbal consent to proceed.  She is on a maintenance regimen for her allergy  shots, receiving them every four weeks at her [facility]. She started the shots last year and reports fewer symptoms this year compared to last year, indicating improvement. She has not experienced any issues with the injections.  She has access to an epinephrine  device.  She states she did have new exposure to a cat since last visit.  She also has a dog in the home.  She did notice some increase in allergy  symptoms when the cat was introduced into the home but this has diminished some.  She takes cetirizine daily for her allergies and inquired about the potential for rebound itch if she stops taking it abruptly after prolonged use. She experienced hives for a few days, which she suspects were caused by a hair product used when she got braids recently. She manages hives with extra cetirizine and Pepcid , which she takes twice daily.  She does on occasion have some reflux symptoms.  She has not had any food reactions recently and has not needed to use her EpiPen .  She is currently staying in the city where colleges over the summer and plans to continue to get her shots they are over the summer.      Review of systems: 10pt ROS negative unless noted above per HPI  Past medical/social/surgical/family history have been reviewed and are unchanged unless specifically indicated below. Times a week like maybe 2 or 3 times No changes  Medication List: Current Outpatient  Medications  Medication Sig Dispense Refill   EPINEPHrine  0.3 mg/0.3 mL IJ SOAJ injection Inject 0.3 mg into the muscle for 1 dose 2 each 1   famotidine  (PEPCID ) 20 MG tablet Take 1 tablet (20 mg total) by mouth 2 (two) times daily. 60 tablet 3   ibuprofen  (ADVIL ) 800 MG tablet Take 1 tablet (800 mg total) by mouth 3 (three) times daily. 60 tablet 1   norethindrone -ethinyl estradiol -FE (JUNEL  FE 1/20) 1-20 MG-MCG tablet Take 1 tablet by mouth daily. 84 tablet 4   Olopatadine -Mometasone  (RYALTRIS ) 665-25 MCG/ACT SUSP Place 1-2 sprays into both nostrils 2 (two) times daily for nasal congestion or drainage 29 g 5   buPROPion  (WELLBUTRIN  XL) 150 MG 24 hr tablet Take 1 tablet (150 mg total) by mouth every morning. (Patient not taking: Reported on 05/05/2024) 30 tablet 1   doxycycline  (VIBRA -TABS) 100 MG tablet Take 1 tablet (100 mg total) by mouth 2 (two) times daily. (Patient not taking: Reported on 05/05/2024) 20 tablet 0   No current facility-administered medications for this visit.     Known medication allergies: Allergies  Allergen Reactions   Cherry Swelling    And hives   Mangifera Indica Hives   Soy Allergy  (Obsolete) Swelling    Soy, mangoes, cherries-tongue and lip edema.  Has epi pen.   Gold-Containing Drug Products Rash   Nickel Rash     Physical examination: Blood pressure 100/78, pulse 100, temperature 98.1 F (36.7 C), temperature source Temporal, resp. rate 16, height 5' 2.99 (1.6  m), weight 181 lb 3.2 oz (82.2 kg), SpO2 98%.  General: Alert, interactive, in no acute distress. HEENT: PERRLA, TMs pearly gray, turbinates minimally edematous without discharge, post-pharynx non erythematous. Neck: Supple without lymphadenopathy. Lungs: Clear to auscultation without wheezing, rhonchi or rales. {no increased work of breathing. CV: Normal S1, S2 without murmurs. Abdomen: Nondistended, nontender. Skin: Warm and dry, without lesions or rashes. Extremities:  No clubbing, cyanosis  or edema. Neuro:   Grossly intact.  Diagnostics/Labs: Immunotherapy injections given today in office  Assessment and plan: Allergic rhinitis - under good control on allergy  shots Continue allergen avoidance measures directed toward dust mite, cockroach, pollen, and pets  Continue allergen immunotherapy at maintenance dosing! Have access to an epinephrine  autoinjector set per protocol.  Discussed today having option of Neffy , nasal epinephrine  device, available. Will send this to [facility] in [place], [place] and they will call you about this prescription.   Continue cetirizine 10 mg once a day at this time.  When ready to try off daily Zyrtec then will want to wean off to prevent rebound itch.   Continue Ryaltris  2 sprays in each nostril once or twice a day as needed for nasal symptoms Consider saline nasal rinses as needed for nasal symptoms. Use this before any medicated nasal sprays for best result Some over the counter eye drops include Pataday  one drop in each eye once a day as needed for red, itchy eyes OR Zaditor one drop in each eye twice a day as needed for red itchy eyes. Avoid eye drops that say red eye relief as they may contain medications that dry out your eyes.   Hives (urticaria) Take the least amount of medications while remaining hive free Cetirizine (Zyrtec) 10mg  twice a day and famotidine  (Pepcid ) 20 mg twice a day. If no symptoms for 7-14 days then decrease to. Cetirizine (Zyrtec) 10mg  twice a day and famotidine  (Pepcid ) 20 mg once a day.  If no symptoms for 7-14 days then decrease to. Cetirizine (Zyrtec) 10mg  twice a day.  If no symptoms for 7-14 days then decrease to. Cetirizine (Zyrtec) 10mg  once a day.  May use Benadryl (diphenhydramine) as needed for breakthrough hives       If symptoms return, then step up dosage Keep a detailed symptom journal including foods eaten, contact with allergens, medications taken, weather changes.   Food allergy  Continue to avoid  soy, peach, cherry, and mango.  In case of an allergic reaction, give Benadryl 50 mg every 4 hours, and if life-threatening symptoms occur, inject with EpiPen  0.3 mg or use intranasal Neffy .  Oral allergy  syndrome Continue to avoid fresh apple and fruits that bother your mouth.  Okay to continue with applesauce and apple juice as these are not bothersome Can try these fruits with slow re-introduction next year if allergy  symptoms are less during pollen season and/or you are needed less medications.  These will both be good signs that pollen allergy  is resolving.  The oral allergy  syndrome (OAS) or pollen-food allergy  syndrome (PFAS) is a relatively common form of food allergy , particularly in adults. It typically occurs in people who have pollen allergies when the immune system sees proteins on the food that look like proteins on the pollen. This results in the allergy  antibody (IgE) binding to the food instead of the pollen. Patients typically report itching and/or mild swelling of the mouth and throat immediately following ingestion of certain uncooked fruits (including nuts) or raw vegetables. Only a very small number of affected individuals  experience systemic allergic reactions, such as anaphylaxis which occurs with true food allergies.    Follow up in 6 months or sooner if needed.  I appreciate the opportunity to take part in Ellary's care. Please do not hesitate to contact me with questions.  Sincerely,   Catha Clink, MD Allergy /Immunology [facility]

## 2024-05-05 NOTE — Patient Instructions (Addendum)
 Allergic rhinitis - under good control on allergy  shots Continue allergen avoidance measures directed toward dust mite, cockroach, pollen, and pets  Continue allergen immunotherapy at maintenance dosing! Have access to an epinephrine  autoinjector set per protocol.  Discussed today having option of Neffy , nasal epinephrine  device, available. Will send this to [facility] in [place], [place] and they will call you about this prescription.   Continue cetirizine 10 mg once a day at this time.  When ready to try off daily Zyrtec then will want to wean off to prevent rebound itch.   Continue Ryaltris  2 sprays in each nostril once or twice a day as needed for nasal symptoms Consider saline nasal rinses as needed for nasal symptoms. Use this before any medicated nasal sprays for best result Some over the counter eye drops include Pataday  one drop in each eye once a day as needed for red, itchy eyes OR Zaditor one drop in each eye twice a day as needed for red itchy eyes. Avoid eye drops that say red eye relief as they may contain medications that dry out your eyes.   Hives (urticaria) Take the least amount of medications while remaining hive free Cetirizine (Zyrtec) 10mg  twice a day and famotidine  (Pepcid ) 20 mg twice a day. If no symptoms for 7-14 days then decrease to. Cetirizine (Zyrtec) 10mg  twice a day and famotidine  (Pepcid ) 20 mg once a day.  If no symptoms for 7-14 days then decrease to. Cetirizine (Zyrtec) 10mg  twice a day.  If no symptoms for 7-14 days then decrease to. Cetirizine (Zyrtec) 10mg  once a day.  May use Benadryl (diphenhydramine) as needed for breakthrough hives       If symptoms return, then step up dosage Keep a detailed symptom journal including foods eaten, contact with allergens, medications taken, weather changes.   Food allergy  Continue to avoid soy, peach, cherry, and mango.  In case of an allergic reaction, give Benadryl 50 mg every 4 hours, and if life-threatening symptoms  occur, inject with EpiPen  0.3 mg or use intranasal Neffy .  Oral allergy  syndrome Continue to avoid fresh apple and fruits that bother your mouth.  Okay to continue with applesauce and apple juice as these are not bothersome Can try these fruits with slow re-introduction next year if allergy  symptoms are less during pollen season and/or you are needed less medications.  These will both be good signs that pollen allergy  is resolving.  The oral allergy  syndrome (OAS) or pollen-food allergy  syndrome (PFAS) is a relatively common form of food allergy , particularly in adults. It typically occurs in people who have pollen allergies when the immune system sees proteins on the food that look like proteins on the pollen. This results in the allergy  antibody (IgE) binding to the food instead of the pollen. Patients typically report itching and/or mild swelling of the mouth and throat immediately following ingestion of certain uncooked fruits (including nuts) or raw vegetables. Only a very small number of affected individuals experience systemic allergic reactions, such as anaphylaxis which occurs with true food allergies.        Follow up in 6 months or sooner if needed.

## 2024-05-29 ENCOUNTER — Other Ambulatory Visit (HOSPITAL_COMMUNITY): Payer: Self-pay

## 2024-05-29 DIAGNOSIS — L700 Acne vulgaris: Secondary | ICD-10-CM | POA: Diagnosis not present

## 2024-05-29 DIAGNOSIS — L910 Hypertrophic scar: Secondary | ICD-10-CM | POA: Diagnosis not present

## 2024-05-29 DIAGNOSIS — L918 Other hypertrophic disorders of the skin: Secondary | ICD-10-CM | POA: Diagnosis not present

## 2024-05-29 DIAGNOSIS — D239 Other benign neoplasm of skin, unspecified: Secondary | ICD-10-CM | POA: Diagnosis not present

## 2024-05-29 DIAGNOSIS — L219 Seborrheic dermatitis, unspecified: Secondary | ICD-10-CM | POA: Diagnosis not present

## 2024-05-29 DIAGNOSIS — L089 Local infection of the skin and subcutaneous tissue, unspecified: Secondary | ICD-10-CM | POA: Diagnosis not present

## 2024-05-29 MED ORDER — CLOBETASOL PROPIONATE 0.05 % EX SOLN
1.0000 | Freq: Two times a day (BID) | CUTANEOUS | 6 refills | Status: AC | PRN
  Filled 2024-05-29: qty 50, 25d supply, fill #0
  Filled 2024-07-05 – 2024-08-28 (×4): qty 50, 25d supply, fill #1

## 2024-05-29 MED ORDER — HYDROCORTISONE 2.5 % EX CREA
1.0000 | TOPICAL_CREAM | Freq: Two times a day (BID) | CUTANEOUS | 3 refills | Status: AC | PRN
  Filled 2024-05-29: qty 30, 10d supply, fill #0

## 2024-05-31 DIAGNOSIS — M67472 Ganglion, left ankle and foot: Secondary | ICD-10-CM | POA: Diagnosis not present

## 2024-06-05 DIAGNOSIS — M67472 Ganglion, left ankle and foot: Secondary | ICD-10-CM | POA: Diagnosis not present

## 2024-06-05 DIAGNOSIS — M79672 Pain in left foot: Secondary | ICD-10-CM | POA: Diagnosis not present

## 2024-06-14 DIAGNOSIS — Z516 Encounter for desensitization to allergens: Secondary | ICD-10-CM | POA: Diagnosis not present

## 2024-06-21 DIAGNOSIS — Z516 Encounter for desensitization to allergens: Secondary | ICD-10-CM | POA: Diagnosis not present

## 2024-06-26 ENCOUNTER — Other Ambulatory Visit: Payer: Self-pay | Admitting: Nurse Practitioner

## 2024-06-26 ENCOUNTER — Other Ambulatory Visit (HOSPITAL_COMMUNITY): Payer: Self-pay

## 2024-06-26 ENCOUNTER — Encounter: Payer: Self-pay | Admitting: Nurse Practitioner

## 2024-06-26 DIAGNOSIS — H1189 Other specified disorders of conjunctiva: Secondary | ICD-10-CM

## 2024-06-26 DIAGNOSIS — Z113 Encounter for screening for infections with a predominantly sexual mode of transmission: Secondary | ICD-10-CM | POA: Diagnosis not present

## 2024-06-26 DIAGNOSIS — Z3040 Encounter for surveillance of contraceptives, unspecified: Secondary | ICD-10-CM | POA: Diagnosis not present

## 2024-06-26 DIAGNOSIS — Z01818 Encounter for other preprocedural examination: Secondary | ICD-10-CM | POA: Diagnosis not present

## 2024-06-26 DIAGNOSIS — Z1330 Encounter for screening examination for mental health and behavioral disorders, unspecified: Secondary | ICD-10-CM | POA: Diagnosis not present

## 2024-06-26 DIAGNOSIS — Z01419 Encounter for gynecological examination (general) (routine) without abnormal findings: Secondary | ICD-10-CM | POA: Diagnosis not present

## 2024-06-26 DIAGNOSIS — H0279 Other degenerative disorders of eyelid and periocular area: Secondary | ICD-10-CM | POA: Diagnosis not present

## 2024-06-26 DIAGNOSIS — Z6826 Body mass index (BMI) 26.0-26.9, adult: Secondary | ICD-10-CM | POA: Diagnosis not present

## 2024-06-26 DIAGNOSIS — Z124 Encounter for screening for malignant neoplasm of cervix: Secondary | ICD-10-CM | POA: Diagnosis not present

## 2024-06-26 MED ORDER — LORAZEPAM 1 MG PO TABS
1.0000 mg | ORAL_TABLET | ORAL | 0 refills | Status: AC
  Filled 2024-06-26 (×2): qty 1, 1d supply, fill #0

## 2024-06-26 MED ORDER — ETONOGESTREL-ETHINYL ESTRADIOL 0.12-0.015 MG/24HR VA RING
1.0000 | VAGINAL_RING | VAGINAL | 4 refills | Status: AC
  Filled 2024-06-26: qty 3, 84d supply, fill #0
  Filled 2024-08-15 – 2024-09-04 (×3): qty 3, 84d supply, fill #1
  Filled 2024-09-26 – 2024-12-12 (×3): qty 3, 84d supply, fill #2

## 2024-06-26 MED ORDER — DIAZEPAM 5 MG PO TABS
5.0000 mg | ORAL_TABLET | ORAL | 0 refills | Status: AC
  Filled 2024-06-26 (×2): qty 1, 1d supply, fill #0

## 2024-06-26 MED ORDER — NORETHIN ACE-ETH ESTRAD-FE 1-20 MG-MCG PO TABS: 1.0000 | ORAL_TABLET | Freq: Every day | ORAL | 4 refills | Status: AC

## 2024-06-27 ENCOUNTER — Other Ambulatory Visit (HOSPITAL_COMMUNITY): Payer: Self-pay

## 2024-06-27 ENCOUNTER — Encounter: Payer: Self-pay | Admitting: Nurse Practitioner

## 2024-06-28 DIAGNOSIS — L910 Hypertrophic scar: Secondary | ICD-10-CM | POA: Diagnosis not present

## 2024-06-28 DIAGNOSIS — L700 Acne vulgaris: Secondary | ICD-10-CM | POA: Diagnosis not present

## 2024-06-28 DIAGNOSIS — T148XXS Other injury of unspecified body region, sequela: Secondary | ICD-10-CM | POA: Diagnosis not present

## 2024-06-28 DIAGNOSIS — L219 Seborrheic dermatitis, unspecified: Secondary | ICD-10-CM | POA: Diagnosis not present

## 2024-06-29 DIAGNOSIS — Q159 Congenital malformation of eye, unspecified: Secondary | ICD-10-CM | POA: Diagnosis not present

## 2024-06-30 ENCOUNTER — Other Ambulatory Visit

## 2024-07-03 DIAGNOSIS — Z516 Encounter for desensitization to allergens: Secondary | ICD-10-CM | POA: Diagnosis not present

## 2024-07-05 ENCOUNTER — Other Ambulatory Visit (HOSPITAL_COMMUNITY): Payer: Self-pay

## 2024-07-05 MED ORDER — NEOMYCIN-POLYMYXIN-DEXAMETH 3.5-10000-0.1 OP OINT
TOPICAL_OINTMENT | OPHTHALMIC | 0 refills | Status: AC
  Filled 2024-07-05 (×2): qty 3.5, 3d supply, fill #0

## 2024-07-06 ENCOUNTER — Encounter: Payer: Self-pay | Admitting: Pharmacist

## 2024-07-06 ENCOUNTER — Other Ambulatory Visit: Payer: Self-pay

## 2024-07-10 DIAGNOSIS — H0279 Other degenerative disorders of eyelid and periocular area: Secondary | ICD-10-CM | POA: Diagnosis not present

## 2024-07-10 DIAGNOSIS — Z01818 Encounter for other preprocedural examination: Secondary | ICD-10-CM | POA: Diagnosis not present

## 2024-07-10 DIAGNOSIS — Q107 Congenital malformation of orbit: Secondary | ICD-10-CM | POA: Diagnosis not present

## 2024-07-10 DIAGNOSIS — H5069 Other mechanical strabismus: Secondary | ICD-10-CM | POA: Diagnosis not present

## 2024-07-10 DIAGNOSIS — D1779 Benign lipomatous neoplasm of other sites: Secondary | ICD-10-CM | POA: Diagnosis not present

## 2024-07-10 DIAGNOSIS — D3102 Benign neoplasm of left conjunctiva: Secondary | ICD-10-CM | POA: Diagnosis not present

## 2024-07-10 DIAGNOSIS — H5060 Mechanical strabismus, unspecified: Secondary | ICD-10-CM | POA: Diagnosis not present

## 2024-07-10 DIAGNOSIS — H1189 Other specified disorders of conjunctiva: Secondary | ICD-10-CM | POA: Diagnosis not present

## 2024-07-11 ENCOUNTER — Other Ambulatory Visit: Payer: Self-pay

## 2024-07-11 ENCOUNTER — Ambulatory Visit (INDEPENDENT_AMBULATORY_CARE_PROVIDER_SITE_OTHER)

## 2024-07-11 DIAGNOSIS — J309 Allergic rhinitis, unspecified: Secondary | ICD-10-CM | POA: Diagnosis not present

## 2024-08-08 DIAGNOSIS — Z516 Encounter for desensitization to allergens: Secondary | ICD-10-CM | POA: Diagnosis not present

## 2024-08-15 ENCOUNTER — Other Ambulatory Visit: Payer: Self-pay

## 2024-08-28 ENCOUNTER — Other Ambulatory Visit: Payer: Self-pay

## 2024-08-28 ENCOUNTER — Other Ambulatory Visit (HOSPITAL_COMMUNITY): Payer: Self-pay

## 2024-09-04 ENCOUNTER — Other Ambulatory Visit (HOSPITAL_COMMUNITY): Payer: Self-pay

## 2024-09-05 DIAGNOSIS — Z113 Encounter for screening for infections with a predominantly sexual mode of transmission: Secondary | ICD-10-CM | POA: Diagnosis not present

## 2024-09-05 DIAGNOSIS — R87615 Unsatisfactory cytologic smear of cervix: Secondary | ICD-10-CM | POA: Diagnosis not present

## 2024-09-05 DIAGNOSIS — Z124 Encounter for screening for malignant neoplasm of cervix: Secondary | ICD-10-CM | POA: Diagnosis not present

## 2024-09-11 DIAGNOSIS — Z516 Encounter for desensitization to allergens: Secondary | ICD-10-CM | POA: Diagnosis not present

## 2024-09-14 DIAGNOSIS — Z23 Encounter for immunization: Secondary | ICD-10-CM | POA: Diagnosis not present

## 2024-09-26 ENCOUNTER — Other Ambulatory Visit: Payer: Self-pay

## 2024-10-02 DIAGNOSIS — R051 Acute cough: Secondary | ICD-10-CM | POA: Diagnosis not present

## 2024-10-02 DIAGNOSIS — J028 Acute pharyngitis due to other specified organisms: Secondary | ICD-10-CM | POA: Diagnosis not present

## 2024-10-11 DIAGNOSIS — Z516 Encounter for desensitization to allergens: Secondary | ICD-10-CM | POA: Diagnosis not present

## 2024-10-16 ENCOUNTER — Other Ambulatory Visit (HOSPITAL_COMMUNITY): Payer: Self-pay

## 2024-10-18 ENCOUNTER — Telehealth: Payer: Self-pay

## 2024-10-18 NOTE — Telephone Encounter (Signed)
 LVM to schedule vial pick up/first injection on the same day as OV w/Dr. Jeneal on 11/02/24. Wanted to confirm that it was okay to add her to the schedule for that day. Please schedule appointment if/when patient calls back.

## 2024-10-25 ENCOUNTER — Telehealth: Payer: Self-pay

## 2024-10-25 NOTE — Telephone Encounter (Signed)
 Copied from CRM [id number]. Topic: Appointments - Appointment Scheduling >> Oct 25, 2024  9:58 AM Travis F wrote: Patient is calling in to schedule a physical. Attempted to schedule patient. First available appointment was 02/13/25. Patient expressed she needs a physical before the end of the year and wanted to know was there any way she could be seen. Please advise.    LVM - for patient that we do not have any open physical spots before the end of the year with any providers in office. CM, CMA.

## 2024-10-27 DIAGNOSIS — J302 Other seasonal allergic rhinitis: Secondary | ICD-10-CM | POA: Diagnosis not present

## 2024-10-27 DIAGNOSIS — J3089 Other allergic rhinitis: Secondary | ICD-10-CM | POA: Diagnosis not present

## 2024-10-27 DIAGNOSIS — J3081 Allergic rhinitis due to animal (cat) (dog) hair and dander: Secondary | ICD-10-CM | POA: Diagnosis not present

## 2024-10-27 DIAGNOSIS — J301 Allergic rhinitis due to pollen: Secondary | ICD-10-CM | POA: Diagnosis not present

## 2024-10-27 NOTE — Progress Notes (Signed)
 VIALS MADE ON 10/27/24

## 2024-10-31 DIAGNOSIS — R102 Pelvic and perineal pain unspecified side: Secondary | ICD-10-CM | POA: Diagnosis not present

## 2024-11-02 ENCOUNTER — Encounter: Payer: Self-pay | Admitting: Allergy

## 2024-11-02 ENCOUNTER — Ambulatory Visit: Admitting: Allergy

## 2024-11-02 ENCOUNTER — Ambulatory Visit

## 2024-11-02 ENCOUNTER — Other Ambulatory Visit: Payer: Self-pay

## 2024-11-02 ENCOUNTER — Other Ambulatory Visit (HOSPITAL_COMMUNITY): Payer: Self-pay

## 2024-11-02 VITALS — BP 112/80 | HR 94 | Temp 98.2°F | Ht 63.0 in | Wt 193.5 lb

## 2024-11-02 DIAGNOSIS — J302 Other seasonal allergic rhinitis: Secondary | ICD-10-CM

## 2024-11-02 DIAGNOSIS — T7819XD Other adverse food reactions, not elsewhere classified, subsequent encounter: Secondary | ICD-10-CM

## 2024-11-02 DIAGNOSIS — H1013 Acute atopic conjunctivitis, bilateral: Secondary | ICD-10-CM

## 2024-11-02 DIAGNOSIS — J3089 Other allergic rhinitis: Secondary | ICD-10-CM | POA: Diagnosis not present

## 2024-11-02 DIAGNOSIS — T7800XD Anaphylactic reaction due to unspecified food, subsequent encounter: Secondary | ICD-10-CM | POA: Diagnosis not present

## 2024-11-02 DIAGNOSIS — L501 Idiopathic urticaria: Secondary | ICD-10-CM | POA: Diagnosis not present

## 2024-11-02 DIAGNOSIS — J309 Allergic rhinitis, unspecified: Secondary | ICD-10-CM

## 2024-11-02 DIAGNOSIS — T7800XA Anaphylactic reaction due to unspecified food, initial encounter: Secondary | ICD-10-CM

## 2024-11-02 MED ORDER — CETIRIZINE HCL 10 MG PO TABS
10.0000 mg | ORAL_TABLET | Freq: Every day | ORAL | 5 refills | Status: AC
  Filled 2024-11-02: qty 30, 30d supply, fill #0

## 2024-11-02 MED ORDER — RYALTRIS 665-25 MCG/ACT NA SUSP: 1.0000 | Freq: Two times a day (BID) | NASAL | 5 refills | Status: AC

## 2024-11-02 NOTE — Progress Notes (Signed)
 Follow-up Note  RE: Victoria Dunn MRN: [id number] DOB: Jun 26, 2001 Date of Office Visit: 11/02/2024   History of present illness: Victoria Dunn is a 23 y.o. female presenting today for follow-up of allergic rhinitis, urticaria, food allergy , oral allergy  syndrome.  She was last seen in the office on 05/05/2024 by myself. Discussed the use of AI scribe software for clinical note transcription with the patient, who gave verbal consent to proceed.  She has been receiving allergy  shots since the summer of 2024 and is currently on a maintenance dose. Her allergy  symptoms are more pronounced when she returns to [place]. Despite this, she notes an improvement in her symptoms over the past year, requiring cetirizine only on an as-needed basis, with her last dose taken the previous night.  She states there have been weeks that she has been able to not take cetirizine. She also uses a nasal spray as needed, but not frequently, and does not require eye drops.  She has been avoiding certain foods due to oral allergy  syndrome, particularly fresh red apples, but has been able to tolerate green apples.  No hives have been experienced in the past six months.   She underwent eye surgery in August, during which she was initially put to sleep and then gradually awakened. She describes the experience as being able to see shadows and feel pressure but not pain during the procedure.  She is currently in school and will be on winter break next week.       Review of systems: 10pt ROS negative unless noted above in HPI   Past medical/social/surgical/family history have been reviewed and are unchanged unless specifically indicated below.  No changes  Medication List: Current Outpatient Medications  Medication Sig Dispense Refill   clobetasol  (TEMOVATE ) 0.05 % external solution Apply throughout the scalp twice daily as needed for itching, scaling, rash. 50 mL 6   EPINEPHrine  0.3 mg/0.3 mL IJ SOAJ  injection Inject 0.3 mg into the muscle for 1 dose (Patient taking differently: Inject 0.3 mg into the muscle as needed.) 2 each 1   etonogestrel -ethinyl estradiol  (NUVARING) 0.12-0.015 MG/24HR vaginal ring Insert 1 ring vaginally every month as directed. 3 each 4   famotidine  (PEPCID ) 20 MG tablet Take 1 tablet (20 mg total) by mouth 2 (two) times daily. 60 tablet 3   hydrocortisone  2.5 % cream Apply to rash on the face twice daily as needed. 30 g 3   ibuprofen  (ADVIL ) 800 MG tablet Take 1 tablet (800 mg total) by mouth 3 (three) times daily. (Patient taking differently: Take 800 mg by mouth as needed.) 60 tablet 1   Olopatadine -Mometasone  (RYALTRIS ) 665-25 MCG/ACT SUSP Place 1-2 sprays into both nostrils 2 (two) times daily for nasal congestion or drainage (Patient taking differently: Place 1-2 sprays into both nostrils as needed.) 29 g 5   buPROPion  (WELLBUTRIN  XL) 150 MG 24 hr tablet Take 1 tablet (150 mg total) by mouth every morning. (Patient not taking: Reported on 05/05/2024) 30 tablet 1   diazepam  (VALIUM ) 5 MG tablet Take 1 tablet (5 mg total) by mouth as directed 60 minutes prior to CT scan. (Patient not taking: Reported on 11/02/2024) 1 tablet 0   LORazepam  (ATIVAN ) 1 MG tablet Take 1 tablet (1 mg total) by mouth as directed 60 minutes prior to CT scan. (Patient not taking: Reported on 11/02/2024) 1 tablet 0   neomycin -polymyxin b-dexamethasone  (MAXITROL ) 3.5-10000-0.1 OINT Apply a small amount onto sutures or operative site twice a day for  three days then stop. 3.5 g 0   norethindrone -ethinyl estradiol -FE (JUNEL  FE 1/20) 1-20 MG-MCG tablet Take 1 tablet by mouth daily. 84 tablet 4   No current facility-administered medications for this visit.     Known medication allergies: Allergies[1]   Physical examination: Blood pressure 112/80, pulse 94, temperature 98.2 F (36.8 C), temperature source Temporal, height 5' 3 (1.6 m), weight 193 lb 8 oz (87.8 kg), SpO2 98%.  General: Alert,  interactive, in no acute distress. HEENT: PERRLA, TMs pearly gray, turbinates non-edematous without discharge, post-pharynx non erythematous. Neck: Supple without lymphadenopathy. Lungs: Clear to auscultation without wheezing, rhonchi or rales. {no increased work of breathing. CV: Normal S1, S2 without murmurs. Abdomen: Nondistended, nontender. Skin: Warm and dry, without lesions or rashes. Extremities:  No clubbing, cyanosis or edema. Neuro:   Grossly intact.  Diagnostics/Labs: None today  Assessment and plan:   Allergic rhinitis - under good control on allergy  shots Continue allergen avoidance measures directed toward dust mite, cockroach, pollen, and pets  Continue allergen immunotherapy at maintenance dosing! Have access to an epinephrine  autoinjector set per protocol.  Continue cetirizine 10 mg daily as needed.   Continue Ryaltris  2 sprays in each nostril once or twice a day as needed for nasal symptoms Consider saline nasal rinses as needed for nasal symptoms. Use this before any medicated nasal sprays for best result Some over the counter eye drops include Pataday  one drop in each eye once a day as needed for red, itchy eyes OR Zaditor one drop in each eye twice a day as needed for red itchy eyes. Avoid eye drops that say red eye relief as they may contain medications that dry out your eyes.   Hives (urticaria) Take the least amount of medications while remaining hive free Cetirizine (Zyrtec) 10mg  twice a day and famotidine  (Pepcid ) 20 mg twice a day. If no symptoms for 7-14 days then decrease to Cetirizine (Zyrtec) 10mg  twice a day and famotidine  (Pepcid ) 20 mg once a day.  If no symptoms for 7-14 days then decrease to Cetirizine (Zyrtec) 10mg  twice a day.  If no symptoms for 7-14 days then decrease to Cetirizine (Zyrtec) 10mg  once a day.  May use Benadryl (diphenhydramine) as needed for breakthrough hives       If symptoms return, then step up dosage Keep a detailed  symptom journal including foods eaten, contact with allergens, medications taken, weather changes.   Food allergy  Continue to avoid soy, peach, cherry, and mango.  In case of an allergic reaction, give Benadryl 50 mg every 4 hours, and if life-threatening symptoms occur, inject with EpiPen  0.3 mg or use intranasal Neffy .  Oral allergy  syndrome Continue to avoid fresh red apple and fruits that bother your mouth.  Okay to continue with applesauce and apple juice as these are not bothersome.  Advised sometime this summer okay to try red apples and see if you tolerate these now. The oral allergy  syndrome (OAS) or pollen-food allergy  syndrome (PFAS) is a relatively common form of food allergy , particularly in adults. It typically occurs in people who have pollen allergies when the immune system sees proteins on the food that look like proteins on the pollen. This results in the allergy  antibody (IgE) binding to the food instead of the pollen. Patients typically report itching and/or mild swelling of the mouth and throat immediately following ingestion of certain uncooked fruits (including nuts) or raw vegetables. Only a very small number of affected individuals experience systemic allergic reactions, such as anaphylaxis  which occurs with true food allergies.    Follow up in 6 months or sooner if needed.  I appreciate the opportunity to take part in Camielle's care. Please do not hesitate to contact me with questions.  Sincerely,   Danita Brain, MD Allergy /Immunology [facility]       [1]  Allergies Allergen Reactions   Cherry Swelling    And hives   Mangifera Indica Hives   Soy Allergy  (Obsolete) Swelling    Soy, mangoes, cherries-tongue and lip edema.  Has epi pen.   Gold-Containing Drug Products Rash   Nickel Rash

## 2024-11-02 NOTE — Patient Instructions (Addendum)
 Allergic rhinitis - under good control on allergy  shots Continue allergen avoidance measures directed toward dust mite, cockroach, pollen, and pets  Continue allergen immunotherapy at maintenance dosing! Have access to an epinephrine  autoinjector set per protocol.  Continue cetirizine 10 mg daily as needed.   Continue Ryaltris  2 sprays in each nostril once or twice a day as needed for nasal symptoms Consider saline nasal rinses as needed for nasal symptoms. Use this before any medicated nasal sprays for best result Some over the counter eye drops include Pataday  one drop in each eye once a day as needed for red, itchy eyes OR Zaditor one drop in each eye twice a day as needed for red itchy eyes. Avoid eye drops that say red eye relief as they may contain medications that dry out your eyes.   Hives (urticaria) Take the least amount of medications while remaining hive free Cetirizine (Zyrtec) 10mg  twice a day and famotidine  (Pepcid ) 20 mg twice a day. If no symptoms for 7-14 days then decrease to Cetirizine (Zyrtec) 10mg  twice a day and famotidine  (Pepcid ) 20 mg once a day.  If no symptoms for 7-14 days then decrease to Cetirizine (Zyrtec) 10mg  twice a day.  If no symptoms for 7-14 days then decrease to Cetirizine (Zyrtec) 10mg  once a day.  May use Benadryl (diphenhydramine) as needed for breakthrough hives       If symptoms return, then step up dosage Keep a detailed symptom journal including foods eaten, contact with allergens, medications taken, weather changes.   Food allergy  Continue to avoid soy, peach, cherry, and mango.  In case of an allergic reaction, give Benadryl 50 mg every 4 hours, and if life-threatening symptoms occur, inject with EpiPen  0.3 mg or use intranasal Neffy .  Oral allergy  syndrome Continue to avoid fresh red apple and fruits that bother your mouth.  Okay to continue with applesauce and apple juice as these are not bothersome.  Advised sometime this summer okay to  try red apples and see if you tolerate these now. The oral allergy  syndrome (OAS) or pollen-food allergy  syndrome (PFAS) is a relatively common form of food allergy , particularly in adults. It typically occurs in people who have pollen allergies when the immune system sees proteins on the food that look like proteins on the pollen. This results in the allergy  antibody (IgE) binding to the food instead of the pollen. Patients typically report itching and/or mild swelling of the mouth and throat immediately following ingestion of certain uncooked fruits (including nuts) or raw vegetables. Only a very small number of affected individuals experience systemic allergic reactions, such as anaphylaxis which occurs with true food allergies.        Follow up in 6 months or sooner if needed.

## 2024-11-09 DIAGNOSIS — R102 Pelvic and perineal pain unspecified side: Secondary | ICD-10-CM | POA: Diagnosis not present

## 2024-12-12 ENCOUNTER — Other Ambulatory Visit: Payer: Self-pay

## 2024-12-13 ENCOUNTER — Other Ambulatory Visit: Payer: Self-pay

## 2024-12-18 ENCOUNTER — Other Ambulatory Visit: Payer: Self-pay

## 2025-02-13 ENCOUNTER — Encounter: Payer: Self-pay | Admitting: Nurse Practitioner

## 2025-11-02 ENCOUNTER — Ambulatory Visit: Admitting: Allergy
